# Patient Record
Sex: Female | Born: 1980 | Hispanic: Yes | Marital: Married | Smoking: Never smoker
Health system: Southern US, Community
[De-identification: ages and names within clinical notes are randomized; demographics above are authoritative.]

## PROBLEM LIST (undated history)

## (undated) DIAGNOSIS — Z789 Other specified health status: Secondary | ICD-10-CM

## (undated) HISTORY — PX: NO PAST SURGERIES: SHX2092

---

## 2002-09-06 ENCOUNTER — Emergency Department (HOSPITAL_COMMUNITY): Admission: EM | Admit: 2002-09-06 | Discharge: 2002-09-07 | Payer: Self-pay | Admitting: Emergency Medicine

## 2002-09-09 ENCOUNTER — Emergency Department (HOSPITAL_COMMUNITY): Admission: EM | Admit: 2002-09-09 | Discharge: 2002-09-09 | Payer: Self-pay

## 2003-03-05 ENCOUNTER — Emergency Department (HOSPITAL_COMMUNITY): Admission: EM | Admit: 2003-03-05 | Discharge: 2003-03-06 | Payer: Self-pay | Admitting: Emergency Medicine

## 2003-04-29 ENCOUNTER — Ambulatory Visit (HOSPITAL_COMMUNITY): Admission: RE | Admit: 2003-04-29 | Discharge: 2003-04-29 | Payer: Self-pay | Admitting: *Deleted

## 2003-05-27 ENCOUNTER — Encounter (INDEPENDENT_AMBULATORY_CARE_PROVIDER_SITE_OTHER): Payer: Self-pay | Admitting: Specialist

## 2003-05-27 ENCOUNTER — Other Ambulatory Visit: Admission: RE | Admit: 2003-05-27 | Discharge: 2003-05-27 | Payer: Self-pay | Admitting: Obstetrics and Gynecology

## 2003-05-27 ENCOUNTER — Encounter: Admission: RE | Admit: 2003-05-27 | Discharge: 2003-05-27 | Payer: Self-pay | Admitting: Obstetrics and Gynecology

## 2003-06-17 ENCOUNTER — Inpatient Hospital Stay (HOSPITAL_COMMUNITY): Admission: AD | Admit: 2003-06-17 | Discharge: 2003-06-18 | Payer: Self-pay | Admitting: *Deleted

## 2003-08-15 ENCOUNTER — Inpatient Hospital Stay (HOSPITAL_COMMUNITY): Admission: AD | Admit: 2003-08-15 | Discharge: 2003-08-15 | Payer: Self-pay | Admitting: Gynecology

## 2003-08-28 ENCOUNTER — Inpatient Hospital Stay (HOSPITAL_COMMUNITY): Admission: AD | Admit: 2003-08-28 | Discharge: 2003-08-28 | Payer: Self-pay | Admitting: *Deleted

## 2003-09-11 ENCOUNTER — Inpatient Hospital Stay (HOSPITAL_COMMUNITY): Admission: AD | Admit: 2003-09-11 | Discharge: 2003-09-11 | Payer: Self-pay | Admitting: Gynecology

## 2003-09-18 ENCOUNTER — Encounter: Admission: RE | Admit: 2003-09-18 | Discharge: 2003-09-18 | Payer: Self-pay | Admitting: *Deleted

## 2003-09-18 ENCOUNTER — Inpatient Hospital Stay (HOSPITAL_COMMUNITY): Admission: AD | Admit: 2003-09-18 | Discharge: 2003-09-21 | Payer: Self-pay | Admitting: Family Medicine

## 2003-09-18 ENCOUNTER — Ambulatory Visit: Payer: Self-pay | Admitting: Family Medicine

## 2005-06-25 IMAGING — US US OB COMP LESS 14 WK
2 series · 14 of 27 positions shown · non-contrast
Comparison: none

CLINICAL DATA: Right lower abdominal pain.  Positive pregnancy test.  
 OBSTETRICAL ULTRASOUND

[Series 1: unknown · 0.23mm/px · 9 of 17 slices shown (1 of 2)]
[im 1/17]
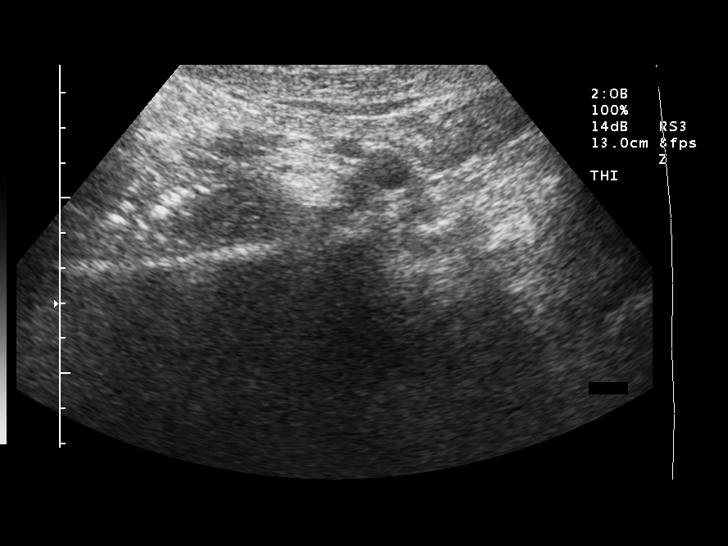
[im 3/17]
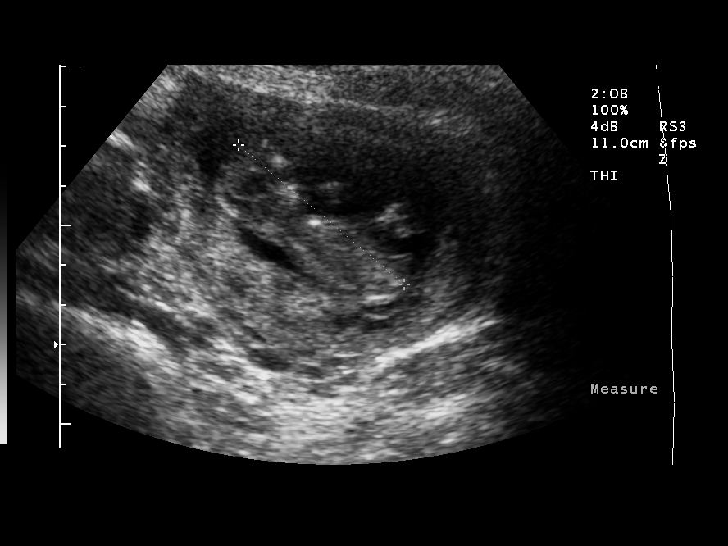
[im 5/17]
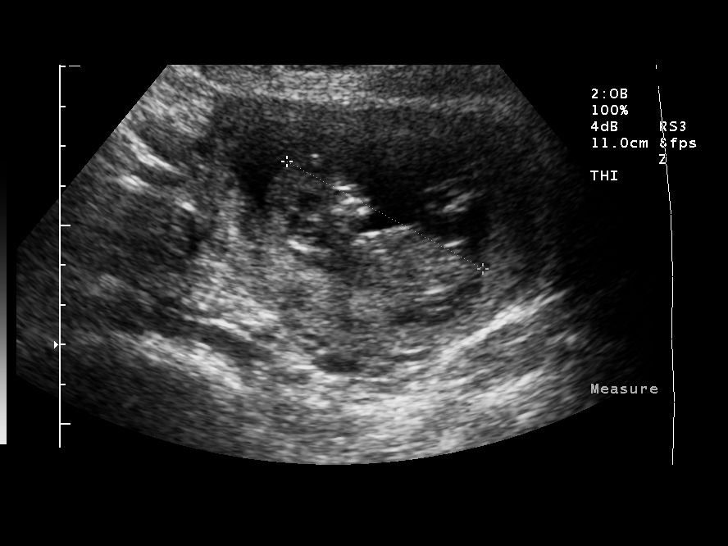
[im 7/17]
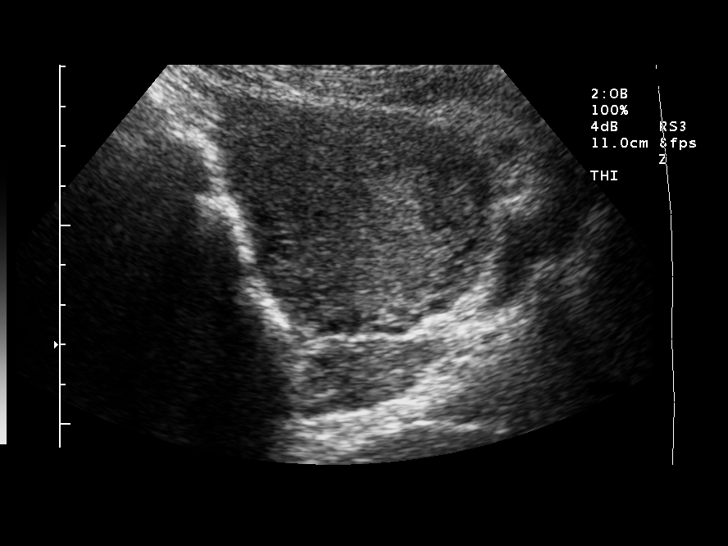
[im 9/17]
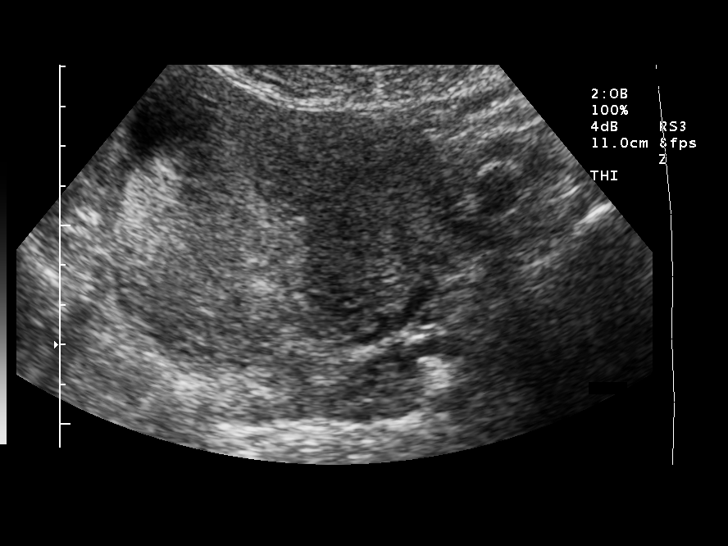
[im 11/17]
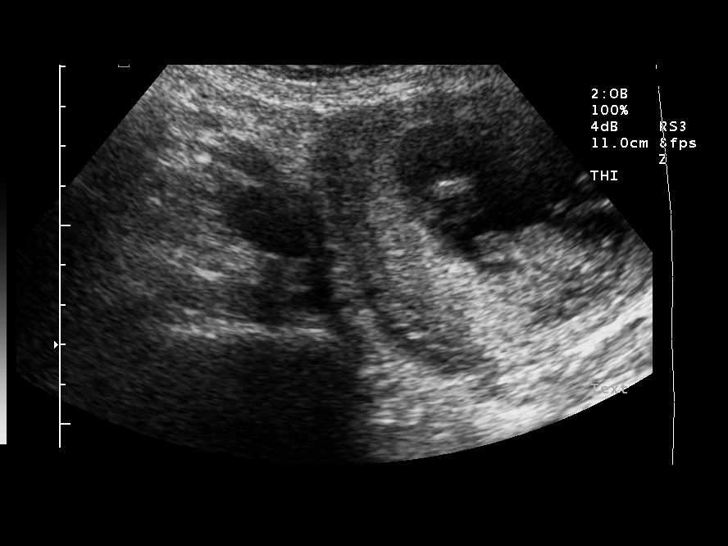
[im 13/17]
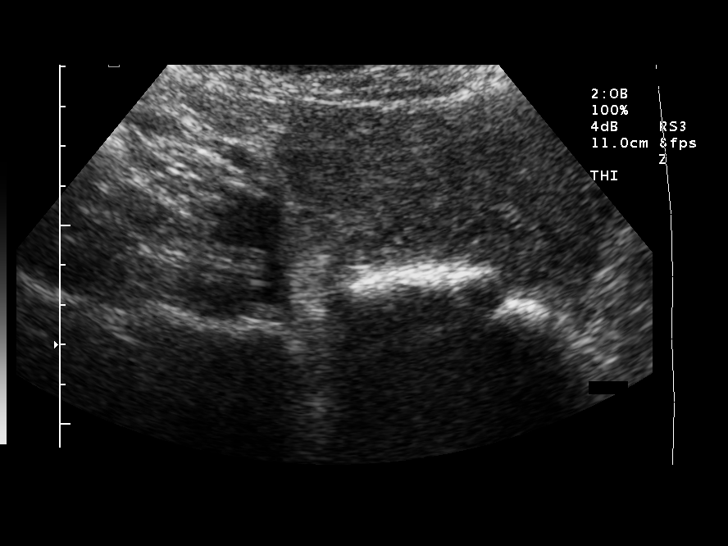
[im 15/17]
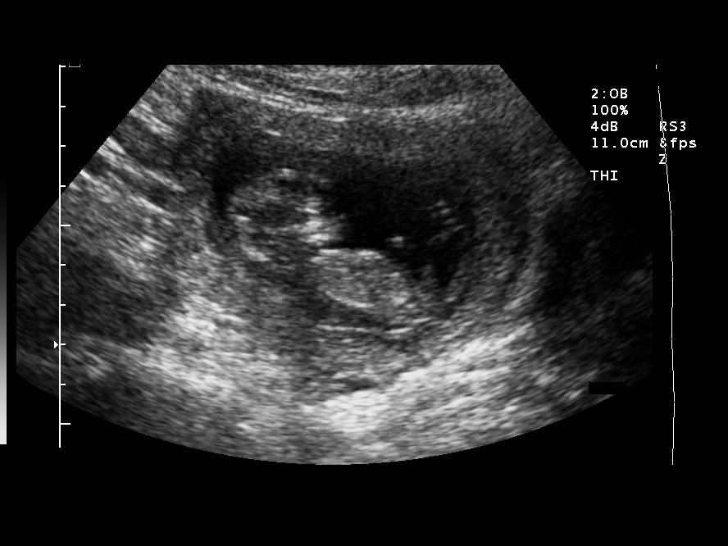
[im 17/17]
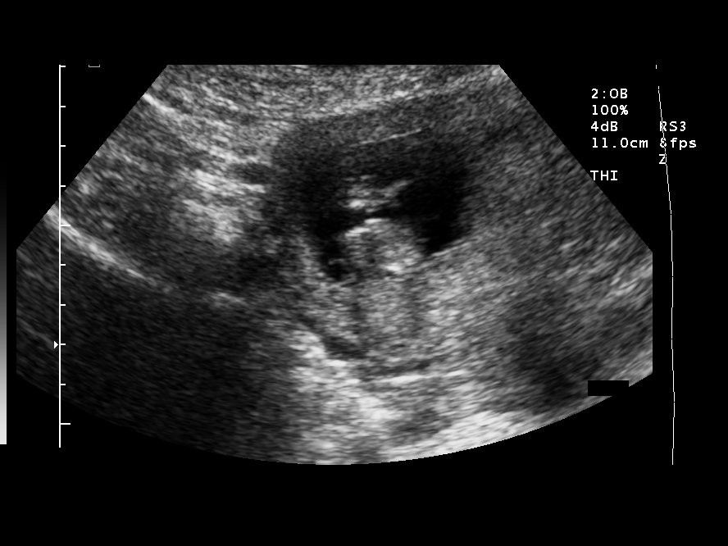

[Series 1: unknown · 0.22mm/px · 5 of 10 slices shown (2 of 2)]
[im 2/10]
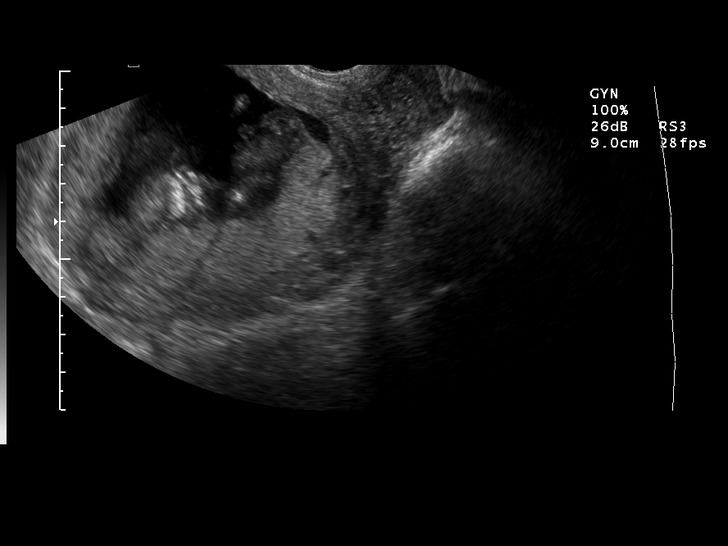
[im 4/10]
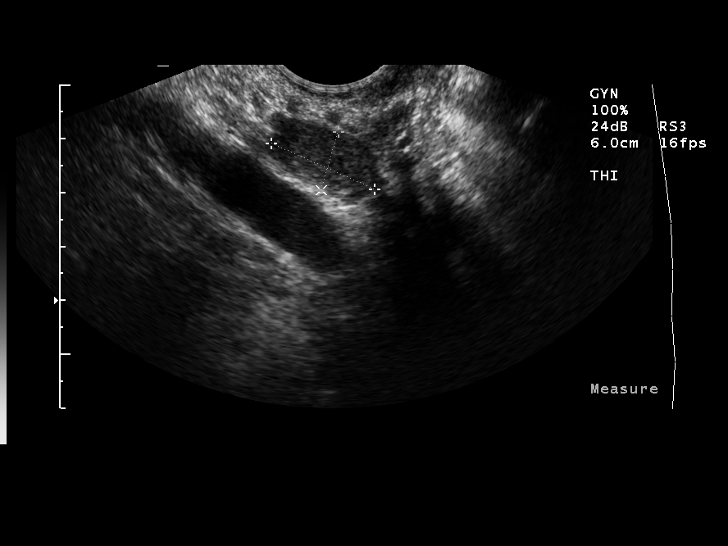
[im 6/10]
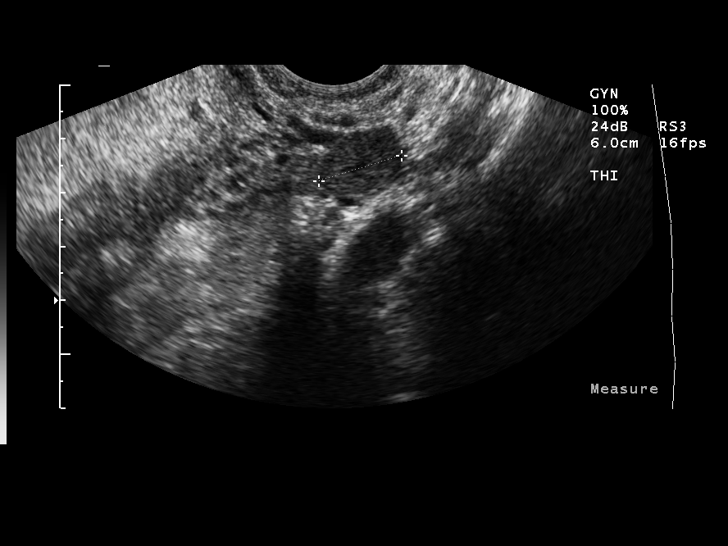
[im 8/10]
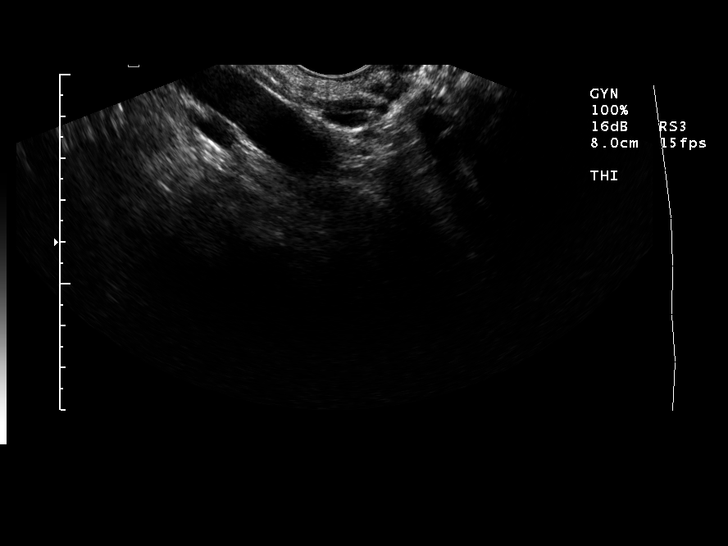
[im 10/10]
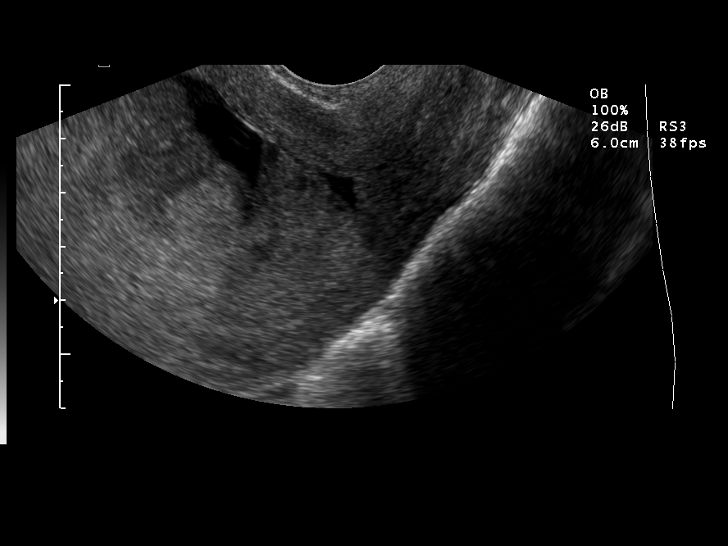

[14 of 27 positions shown; findings below may reference images not displayed]

FINDINGS: Single live intrauterine gestation with crown rump length of 5.5 mm (12 weeks 1 day).  Heart rate at 137 beats per minute.  There is a tiny subchorionic hemorrhage noted.  Left ovary 11 x 16 x 20 mm, unremarkable.  Right ovary is not visualized.  No free fluid.
 IMPRESSION
 Single live 12 week 1 day intrauterine gestation.

## 2005-08-19 IMAGING — US US OB COMP +14 WK
1 series · 13 of 28 positions shown · non-contrast
Comparison: none

CLINICAL DATA: 20 week 1 day assigned gestational age by prior ultrasound.  Evaluate growth and anatomy.

[Series 1: unknown · 0.32mm/px · 13 of 61 slices shown]
[im 3/61]
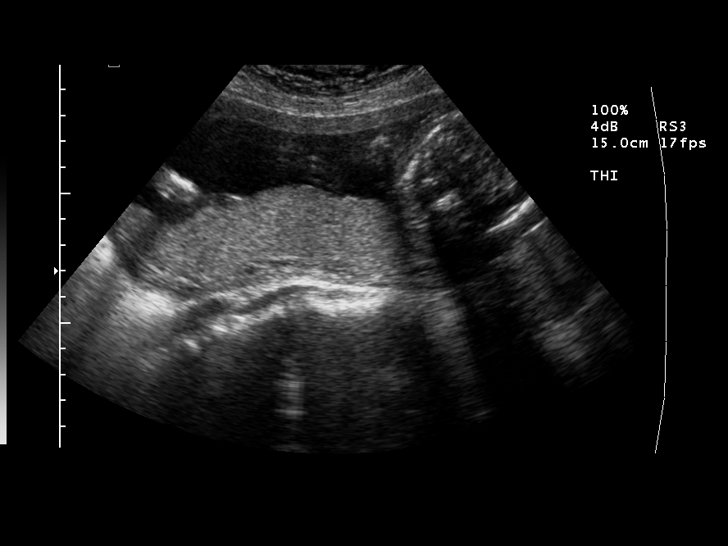
[im 7/61]
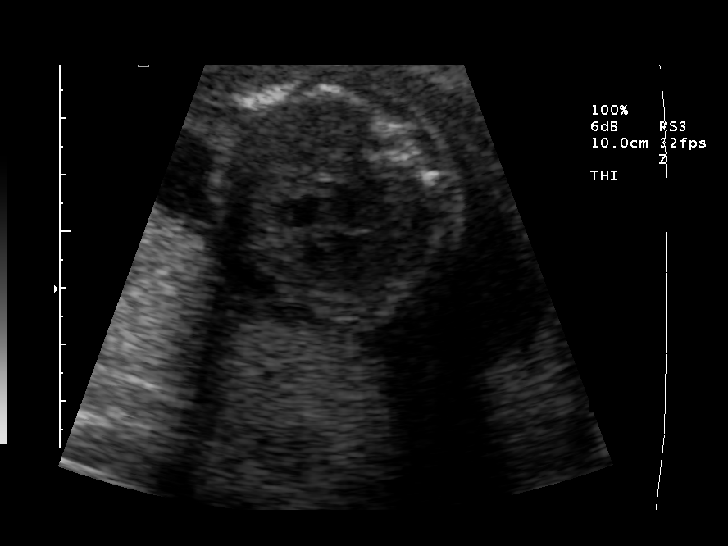
[im 12/61]
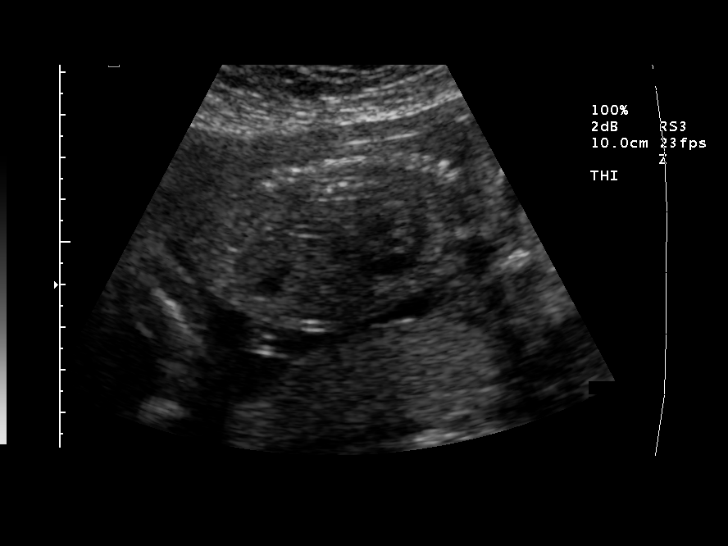
[im 16/61]
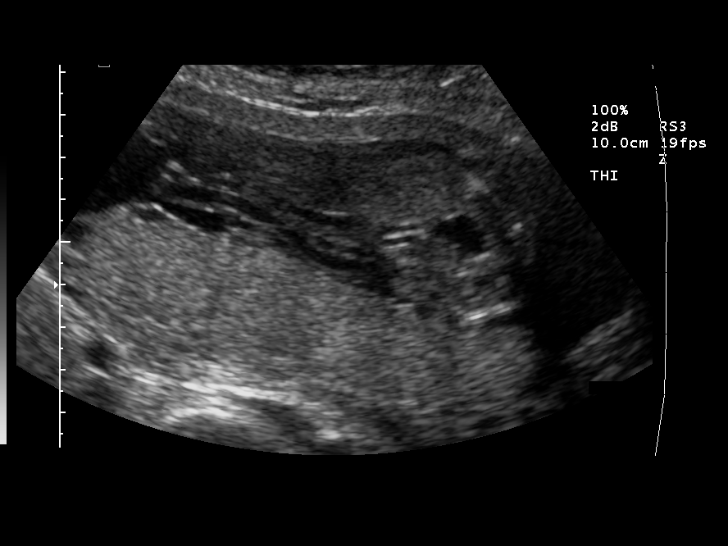
[im 21/61]
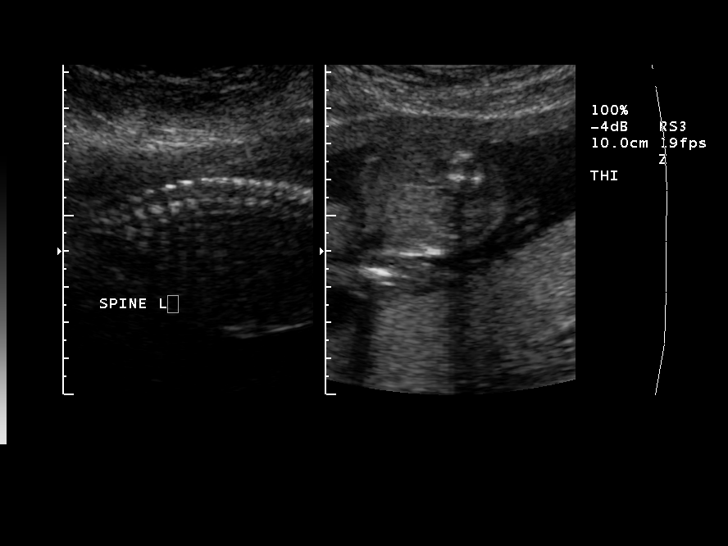
[im 25/61]
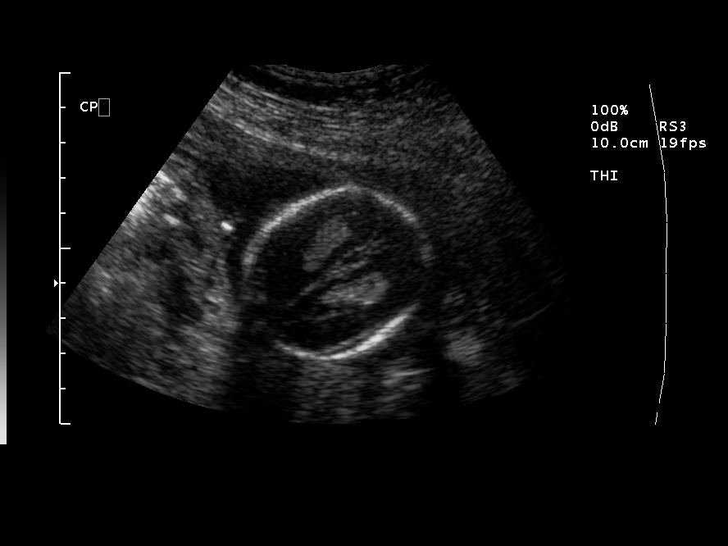
[im 32/61]
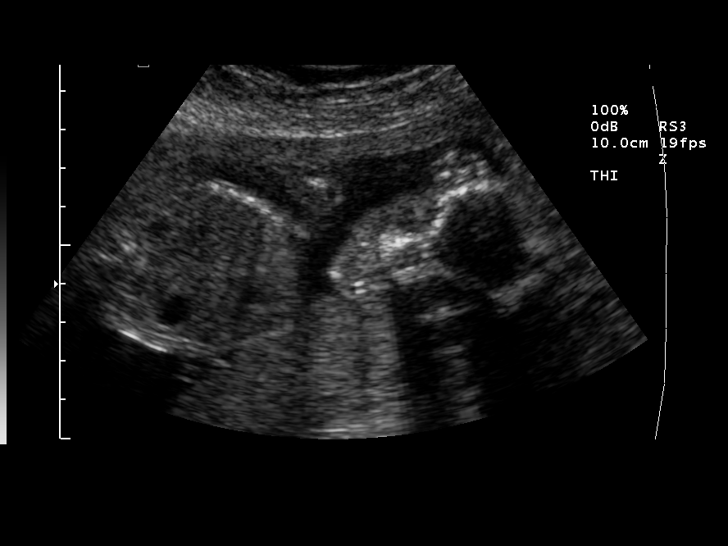
[im 36/61]
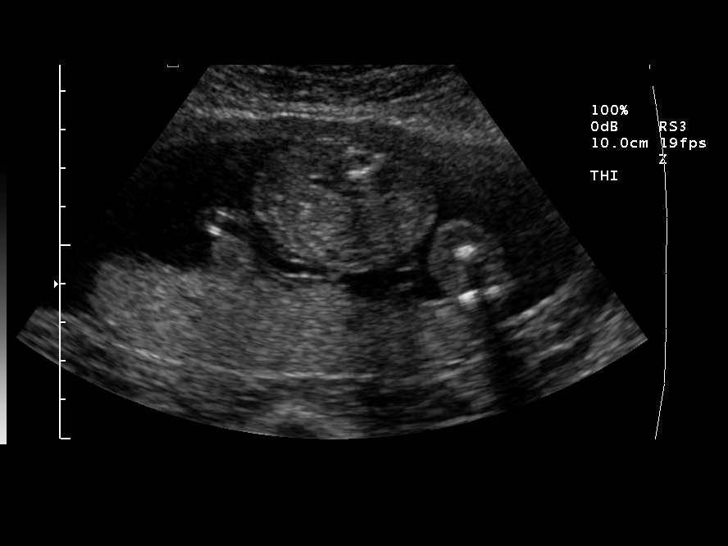
[im 41/61]
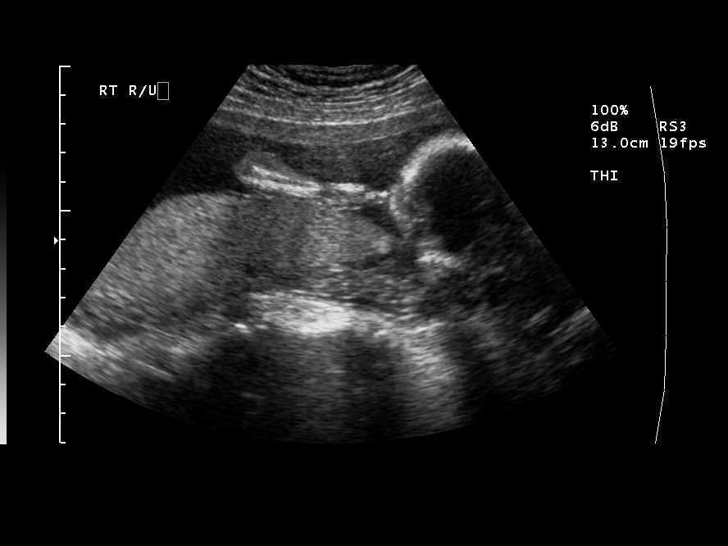
[im 45/61]
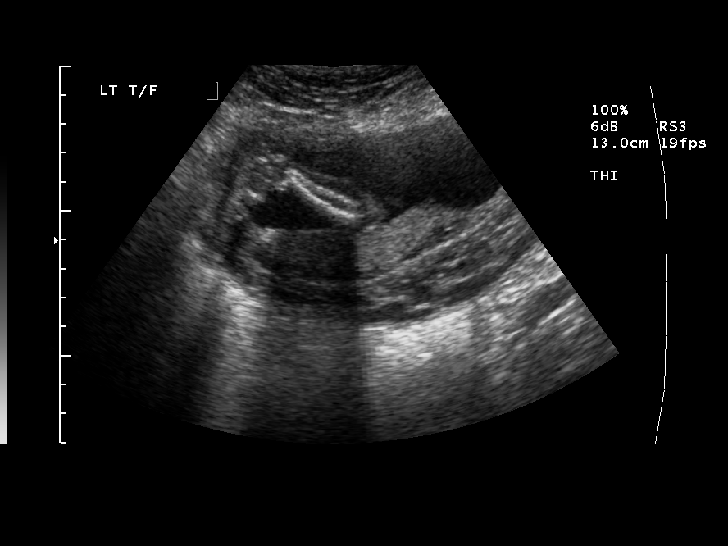
[im 49/61]
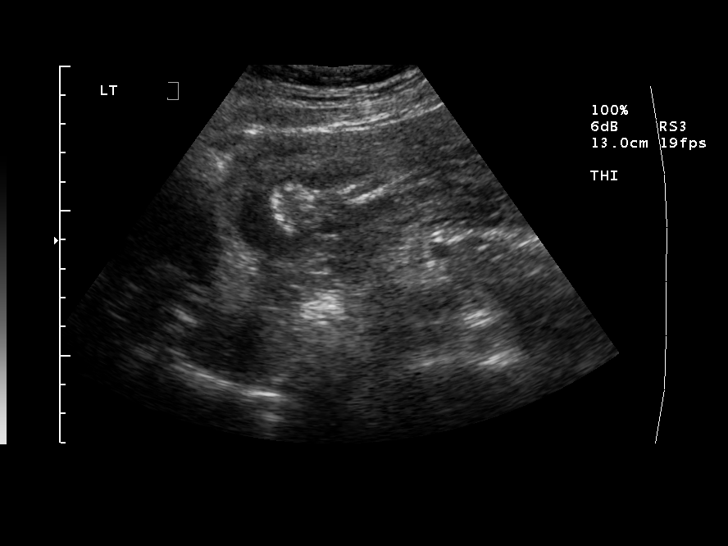
[im 54/61]
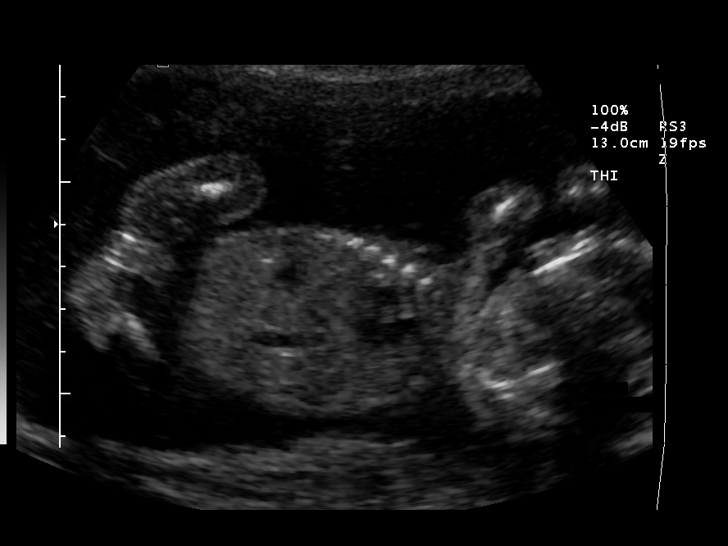
[im 58/61]
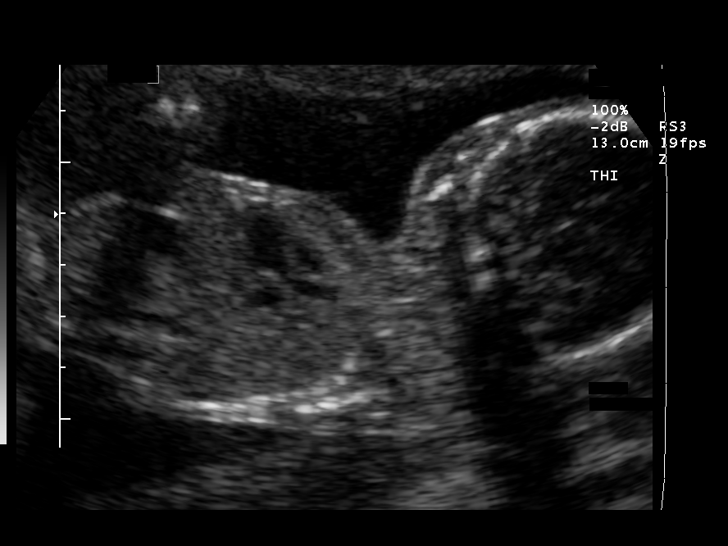

[13 of 28 positions shown; findings below may reference images not displayed]

OBSTETRICAL ULTRASOUND
Number of Fetuses:  1
Heart Rate:  127
Movement:  Yes
Breathing:  No  
Presentation:  Cephalic
Placental Location:  Posterior
Grade:  I
Previa:  No
Amniotic Fluid (Subjective):  Normal
Amniotic Fluid (Objective):   3.4 cm Vertical pocket 

FETAL BIOMETRY
BPD:  4.5 cm   19 w 5 d
HC:  16.9 cm  19 w 4 d
AC:  14.5 cm  19 w 6 d
FL:  3.0 cm  19 w 3 d

MEAN GA:  19 w 5 d

FETAL ANATOMY
Lateral Ventricles:    Visualized 
Thalami/CSP:      Visualized 
Posterior Fossa:  Visualized 
Nuchal Region:    Visualized 
Spine:      Visualized 
4 Chamber Heart on Left:      Visualized 
Stomach on Left:      Visualized 
3 Vessel Cord:    Visualized 
Cord Insertion site:    Visualized 
Kidneys:  Visualized 
Bladder:  Visualized 
Extremities:      Visualized 

ADDITIONAL ANATOMY VISUALIZED:  LVOT, RVOT, upper lip, orbits, diaphragm, heel, 5th digit, ductal arch, aortic arch, and female genitalia

MATERNAL FINDINGS
Cervix:   3.7 cm Transabdominally
IMPRESSION: Assigned gestational age by prior ultrasound is currently 20 weeks 1 day.  Appropriate fetal growth.
No evidence of fetal anatomic abnormality.

## 2006-01-01 IMAGING — US US FETAL BPP W/O NONSTRESS
1 series · 18 of 28 positions shown · non-contrast
Comparison: none

CLINICAL DATA: Reactive NST with variables.  Assess fetal well being.

[Series 1: us fetal bpp w/o nonstress · non-contrast · 18 of 40 slices shown]
[im 1/40]
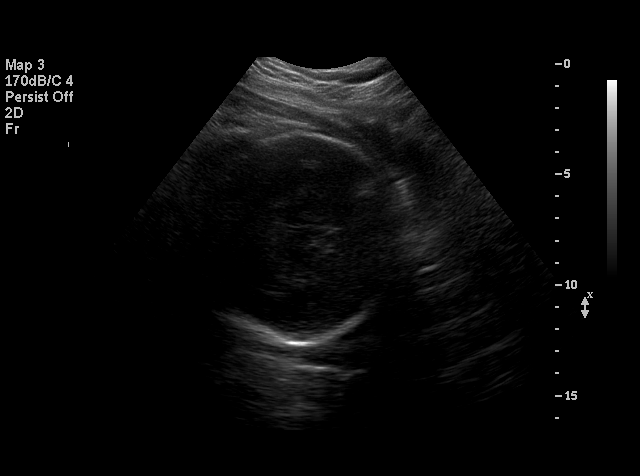
[im 3/40]
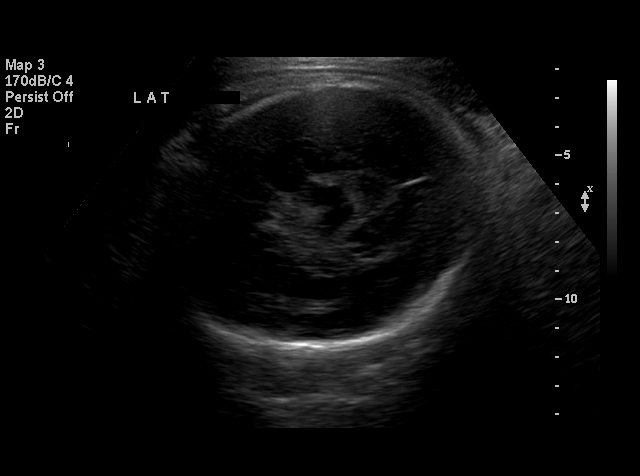
[im 5/40]
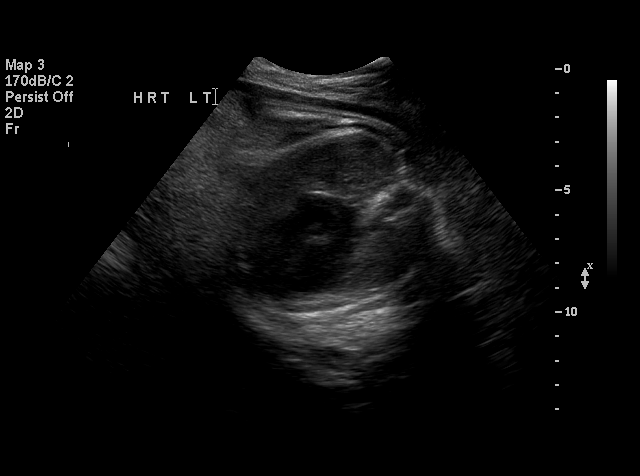
[im 8/40]
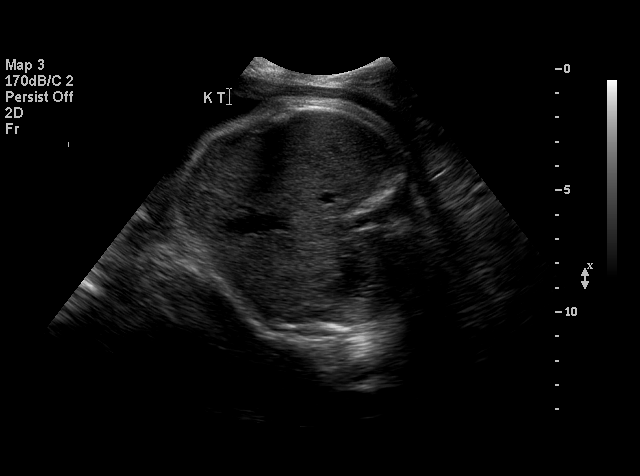
[im 11/40]
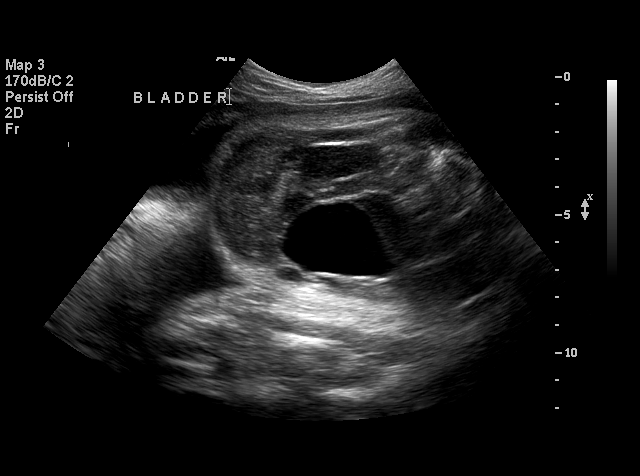
[im 12/40]
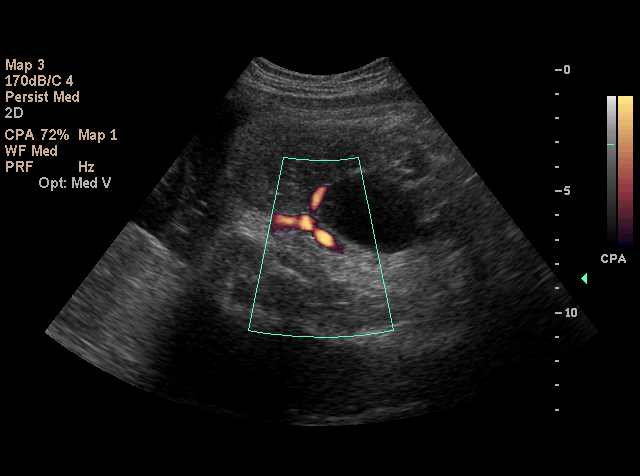
[im 15/40]
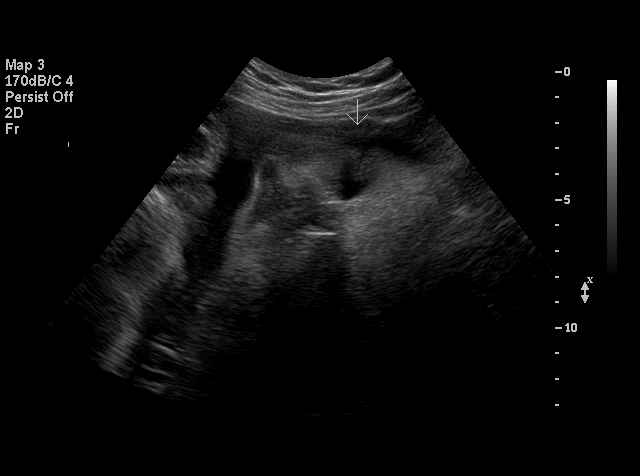
[im 16/40]
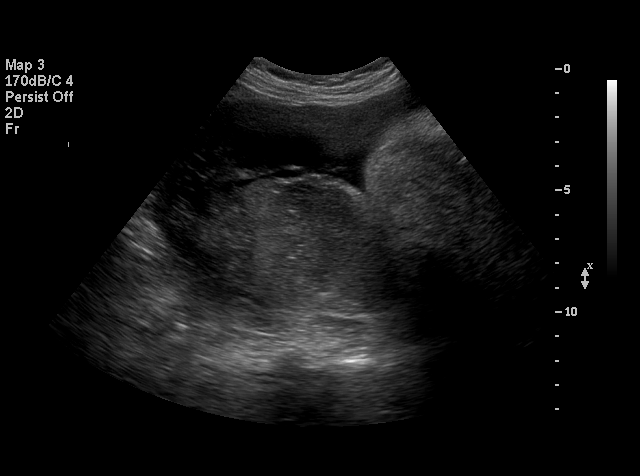
[im 19/40]
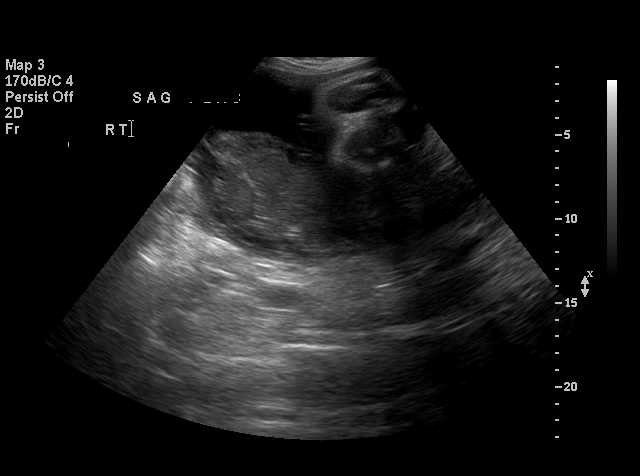
[im 21/40]
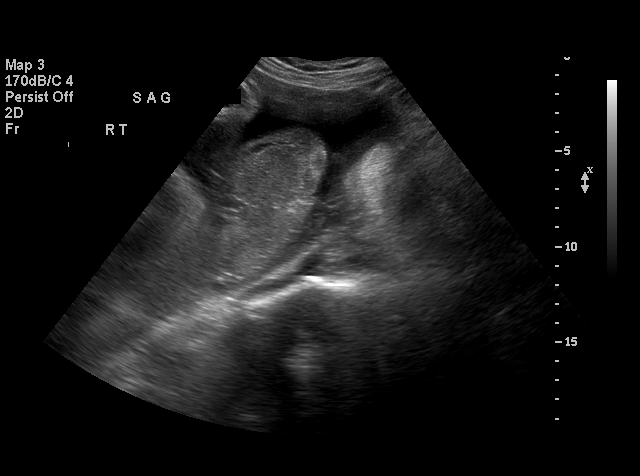
[im 24/40]
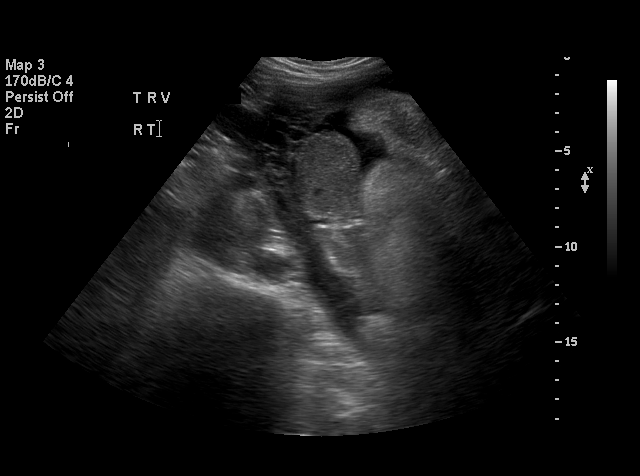
[im 25/40]
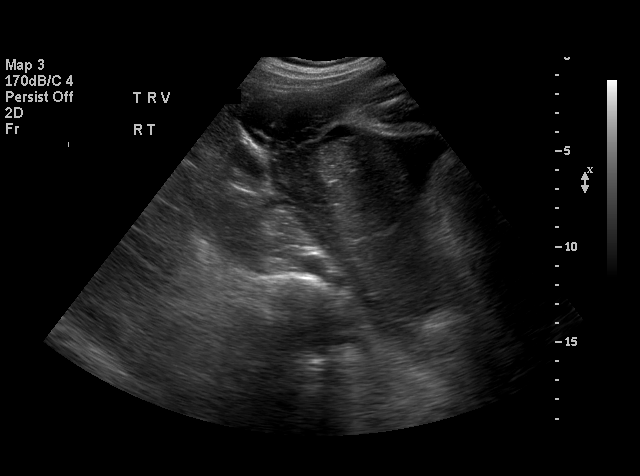
[im 28/40]
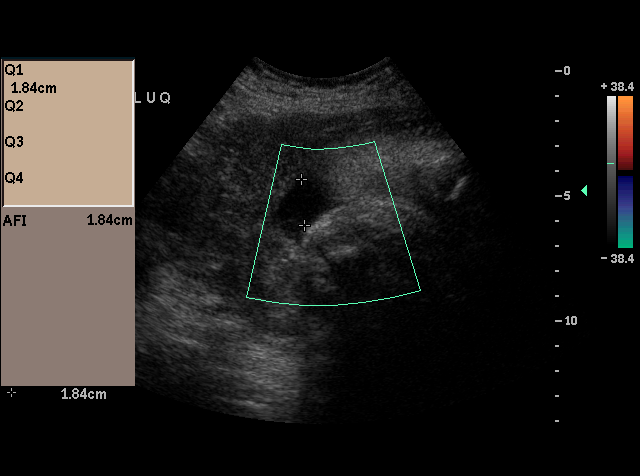
[im 31/40]
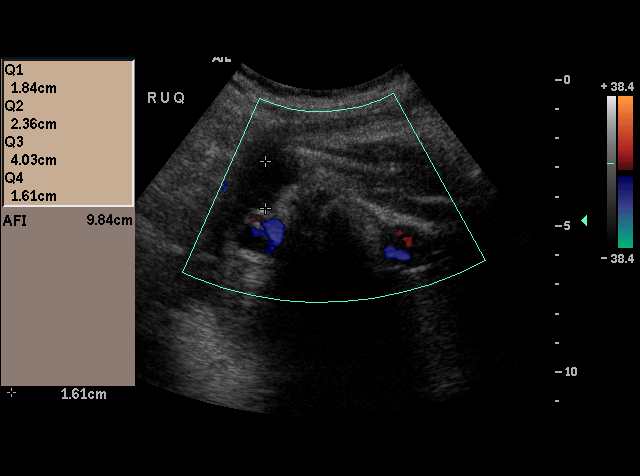
[im 32/40]
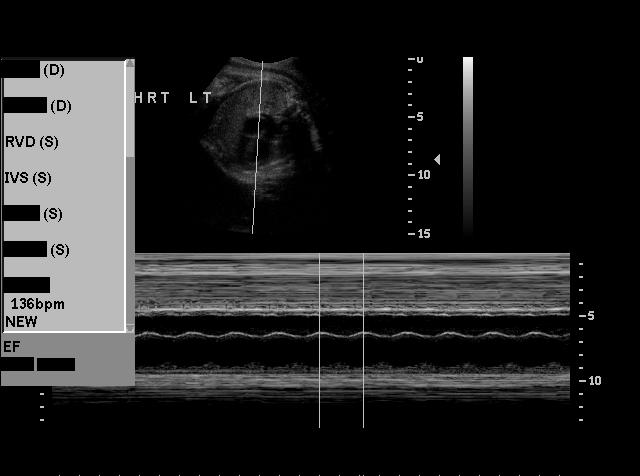
[im 35/40]
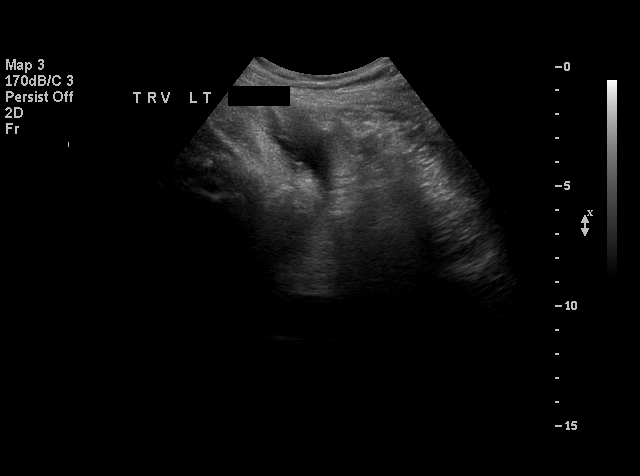
[im 37/40]
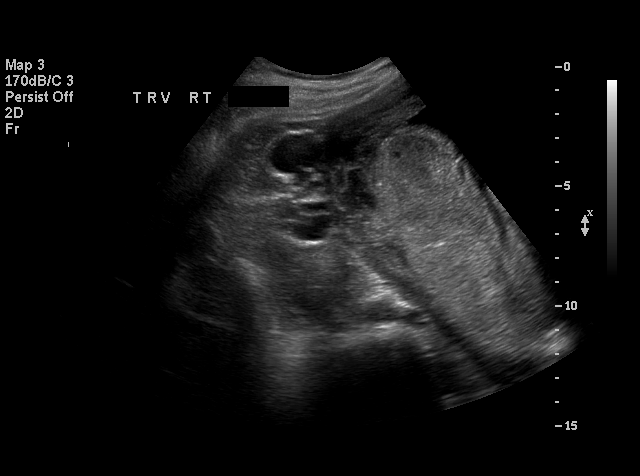
[im 40/40]
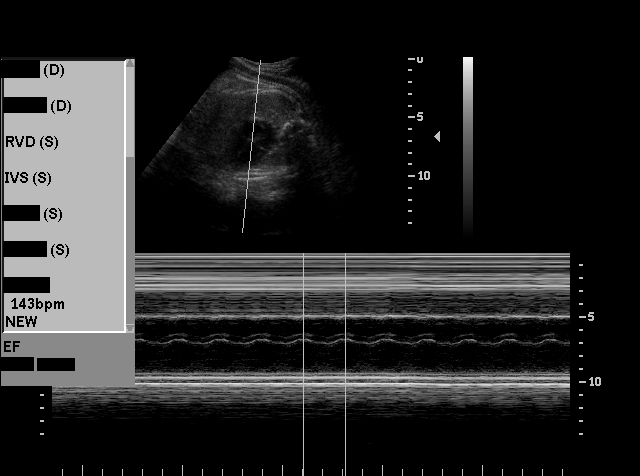

[18 of 28 positions shown; findings below may reference images not displayed]

BIOPHYSICAL PROFILE

Number of Fetuses:  1
Heart rate:  130 BPM
Movement:  Yes
Breathing:  Yes
Presentation:  Vertex
Placental Location:  Posterior
Grade:  II
Previa:  No
Amniotic Fluid (Subjective):  Normal
Amniotic Fluid (Objective):  AFI 9.8 cm (2th-72th %ile =  7.2 to 22.6 cm for 39 weeks) 

Fetal measurements and complete anatomic evaluation were not requested.  The following fetal anatomy was visualized on this exam:  Lateral ventricles, four chamber heart, stomach, three vessel cord, kidneys, bladder, and diaphragm.

BPP SCORING
Movements:  2  Time:  30 minutes
Breathing:  2
Tone:  2
Amniotic Fluid:  2
Total Score:  8

MATERNAL FINDINGS:
Cervix:  Not evaluated, > 37 weeks.
IMPRESSION: Biophysical profile score [DATE].
Subjectively and quantitatively normal amniotic fluid volume.
No late-developing fetal anatomic abnormalities are identified associated with the four chamber heart, lateral ventricles, stomach, kidneys, or bladder.

## 2010-01-09 ENCOUNTER — Emergency Department (HOSPITAL_COMMUNITY)
Admission: EM | Admit: 2010-01-09 | Discharge: 2010-01-09 | Payer: Self-pay | Source: Home / Self Care | Admitting: Emergency Medicine

## 2010-04-19 LAB — URINALYSIS, ROUTINE W REFLEX MICROSCOPIC
Bilirubin Urine: NEGATIVE
Glucose, UA: NEGATIVE mg/dL
Ketones, ur: NEGATIVE mg/dL
Nitrite: NEGATIVE
Protein, ur: NEGATIVE mg/dL
Specific Gravity, Urine: 1.014 (ref 1.005–1.030)
Urobilinogen, UA: 0.2 mg/dL (ref 0.0–1.0)
pH: 6.5 (ref 5.0–8.0)

## 2010-04-19 LAB — POCT PREGNANCY, URINE: Preg Test, Ur: NEGATIVE

## 2010-04-19 LAB — URINE MICROSCOPIC-ADD ON

## 2010-04-19 LAB — WET PREP, GENITAL
Trich, Wet Prep: NONE SEEN
Yeast Wet Prep HPF POC: NONE SEEN

## 2010-04-19 LAB — GC/CHLAMYDIA PROBE AMP, GENITAL
Chlamydia, DNA Probe: NEGATIVE
GC Probe Amp, Genital: NEGATIVE

## 2010-04-19 LAB — HCG, QUANTITATIVE, PREGNANCY: hCG, Beta Chain, Quant, S: <2 m[IU]/mL (ref <5)

## 2010-06-25 NOTE — Group Therapy Note (Signed)
Jodi Santos, Jodi Santos               ACCOUNT NO.:  [id number]   MEDICAL RECORD NO.:  [id number]                   PATIENT TYPE:  OUT   LOCATION:  [facility]                           FACILITY:  [facility]   PHYSICIAN:  Argentina Donovan, MD                     DATE OF BIRTH:  [birth year]   DATE OF SERVICE:  05/27/2003                                    CLINIC NOTE   PROCEDURE DONE:  Colposcopy.   The patient is a 30 year old gravida 5 para 2-0-2-2 pregnant with an  estimated date of confinement of September 16, 2003; i.e. [redacted] weeks gestation.  [facility] and had an HSIL Pap smear.  On examination the  cervix is markedly hypertrophied with large area of squamous metaplasia and  the transition zone seen 360 degrees with marked acetowhite changes around  the entire cervix with multiple areas of mosaicism and punctation, no  atypical vessels seen, heavy leukorrhea for which wet prep and DNA for GC  and chlamydia were taken.  Biopsy was taken of the area that looked the most  abnormal.   IMPRESSION:  Severe cervical dysplasia, at least cervical intraepithelial  lesion grade 2 to 3.                                               Argentina Donovan, MD    PR/MEDQ  D:  05/27/2003  T:  05/27/2003  Job:  [id number]

## 2014-12-09 LAB — OB RESULTS CONSOLE ABO/RH: RH Type: POSITIVE

## 2014-12-09 LAB — OB RESULTS CONSOLE GC/CHLAMYDIA
CHLAMYDIA, DNA PROBE: NEGATIVE
GC PROBE AMP, GENITAL: NEGATIVE

## 2014-12-09 LAB — OB RESULTS CONSOLE HIV ANTIBODY (ROUTINE TESTING): HIV: NONREACTIVE

## 2014-12-09 LAB — OB RESULTS CONSOLE RUBELLA ANTIBODY, IGM: Rubella: IMMUNE

## 2014-12-09 LAB — OB RESULTS CONSOLE ANTIBODY SCREEN: Antibody Screen: NEGATIVE

## 2014-12-09 LAB — OB RESULTS CONSOLE HEPATITIS B SURFACE ANTIGEN: HEP B S AG: NEGATIVE

## 2014-12-24 LAB — OB RESULTS CONSOLE RPR: RPR: NONREACTIVE

## 2015-02-08 NOTE — L&D Delivery Note (Signed)
Patient is 35 y.o. [id number] 822w2d admitted in [facility]. Uncomplicated history. Progressed quickly to complete.    Delivery Note At 11:28 PM a viable female was delivered via Vaginal, Spontaneous Delivery (Presentation: Left Occiput Anterior).  Clinically identified shoulder dystocia with turtling of fetal head at the perineum.  RN performed McRoberts and suprapubic. I performed woodscrew and reverse woodscrew without good result. I was able to grasp the posterior arm (right arm) and this was delivered easily. I was able to feel a pop as the arm delivered but after delivery the infant was moving the right arm well. . APGAR: 8, 9; weight - Pending. Placenta status: Intact, Spontaneous.  Cord: 3 vessels with the following complications: None.  Cord pH: not collected  Anesthesia: None  Episiotomy: None Lacerations: 1st degree;Perineal Suture Repair: 3.0 vicryl Est. Blood Loss (mL): 50  Mom to postpartum.  Baby to Couplet care / Skin to Skin.  Isa RankinKimberly Niles [facility] 07/12/2015, 12:16 AM

## 2015-07-05 ENCOUNTER — Inpatient Hospital Stay (HOSPITAL_COMMUNITY)
Admission: AD | Admit: 2015-07-05 | Discharge: 2015-07-05 | Disposition: A | Discharge status: Home / Self Care | Payer: Self-pay | Source: Ambulatory Visit | Attending: Family Medicine | Admitting: Family Medicine

## 2015-07-05 ENCOUNTER — Encounter (HOSPITAL_COMMUNITY): Payer: Self-pay | Admitting: Certified Nurse Midwife

## 2015-07-05 DIAGNOSIS — O471 False labor at or after 37 completed weeks of gestation: Secondary | ICD-10-CM | POA: Insufficient documentation

## 2015-07-05 DIAGNOSIS — Z3A39 39 weeks gestation of pregnancy: Secondary | ICD-10-CM | POA: Insufficient documentation

## 2015-07-05 DIAGNOSIS — N898 Other specified noninflammatory disorders of vagina: Secondary | ICD-10-CM | POA: Insufficient documentation

## 2015-07-05 HISTORY — DX: Other specified health status: Z78.9

## 2015-07-05 LAB — POCT FERN TEST: POCT Fern Test: NEGATIVE

## 2015-07-05 NOTE — MAU Provider Note (Signed)
S:  Jodi Santos is a 35 y.o. female [id number] @ 964w2d here today with complaints of contractions and vaginal discharge. She noticed an increase in vaginal discharge with ? Leaking yesterday afternoon. The discharge has continued today and she came in to be evaluated to see if her water broke.   She feels occasional contractions. Denies vaginal bleeding. + fetal movement.  [facility] [place] [facility] [facility] Patient brought her records with her today and gave to RN to scan into the system.    O:  GENERAL: Well-developed, well-nourished female in no acute distress.  LUNGS: Effort normal SKIN: Warm, dry and without erythema ABDOMEN: soft, non tender. Gravid  PSYCH: Normal mood and affect  Filed Vitals:   07/05/15 1141  BP: 122/84  Pulse: 97  Temp: 98.4 F (36.9 C)  TempSrc: Oral  Resp: 18   Fetal Tracing: Baseline: 135 bpm  Variability: Moderate  Accelerations: 15x15 Decelerations: None Toco: None  Speculum exam: Vagina - Small amount of creamy discharge, no odor. No pooling of fluid  Cervix - No contact bleeding, no active bleeding, visually closed.  Bimanual exam: Dilation: Closed Effacement (%): Thick Exam by:: Shela CommonsJ. Teresa Nicodemus NP Chaperone present for exam.    A:  Vaginal discharge in pregnancy; Fern slide negative  Braxton hicks contractions    P:  Discharge home in stable condition Keep appointment with OB on Wednesday Labor precautions Return to MAU if symptoms worsen Fetal kick counts.    Duane LopeJennifer I Jareb Radoncic, NP 07/05/2015 1:35 PM

## 2015-07-05 NOTE — MAU Note (Signed)
Pt states she has been having ctxs since yesterday that are 10 minutes apart. Pt denies LOF or vaginal bleeding. Fetus active.

## 2015-07-05 NOTE — Discharge Instructions (Signed)
Braxton Hicks Contractions Contractions of the uterus can occur throughout pregnancy. Contractions are not always a sign that you are in labor.  WHAT ARE BRAXTON HICKS CONTRACTIONS?  Contractions that occur before labor are called Braxton Hicks contractions, or false labor. Toward the end of pregnancy (32-34 weeks), these contractions can develop more often and may become more forceful. This is not true labor because these contractions do not result in opening (dilatation) and thinning of the cervix. They are sometimes difficult to tell apart from true labor because these contractions can be forceful and people have different pain tolerances. You should not feel embarrassed if you go to the hospital with false labor. Sometimes, the only way to tell if you are in true labor is for your health care provider to look for changes in the cervix. If there are no prenatal problems or other health problems associated with the pregnancy, it is completely safe to be sent home with false labor and await the onset of true labor. HOW CAN YOU TELL THE DIFFERENCE BETWEEN TRUE AND FALSE LABOR? False Labor  The contractions of false labor are usually shorter and not as hard as those of true labor.   The contractions are usually irregular.   The contractions are often felt in the front of the lower abdomen and in the groin.   The contractions may go away when you walk around or change positions while lying down.   The contractions get weaker and are shorter lasting as time goes on.   The contractions do not usually become progressively stronger, regular, and closer together as with true labor.  True Labor  Contractions in true labor last 30-70 seconds, become very regular, usually become more intense, and increase in frequency.   The contractions do not go away with walking.   The discomfort is usually felt in the top of the uterus and spreads to the lower abdomen and low back.   True labor can be  determined by your health care provider with an exam. This will show that the cervix is dilating and getting thinner.  WHAT TO REMEMBER  Keep up with your usual exercises and follow other instructions given by your health care provider.   Take medicines as directed by your health care provider.   Keep your regular prenatal appointments.   Eat and drink lightly if you think you are going into labor.   If Braxton Hicks contractions are making you uncomfortable:   Change your position from lying down or resting to walking, or from walking to resting.   Sit and rest in a tub of warm water.   Drink 2-3 glasses of water. Dehydration may cause these contractions.   Do slow and deep breathing several times an hour.  WHEN SHOULD I SEEK IMMEDIATE MEDICAL CARE? Seek immediate medical care if:  Your contractions become stronger, more regular, and closer together.   You have fluid leaking or gushing from your vagina.   You have a fever.   You have vaginal bleeding.   You have continuous abdominal pain.   You have low back pain that you never had before.   You feel your baby's head pushing down and causing pelvic pressure.   Your baby is not moving as much as it used to.    This information is not intended to replace advice given to you by your health care provider. Make sure you discuss any questions you have with your health care provider.   Document Released: 01/24/2005  Document Revised: 01/29/2013 Document Reviewed: 11/05/2012 Elsevier Interactive Patient Education 2016 [employer]. IAC/InterActiveCorpContracciones de Designer, multimediaBraxton Hicks (Braxton Hicks Contractions) Durante el Troutembarazo, pueden presentarse contracciones uterinas que no siempre indican que est en Ogdentrabajo de parto.  QU SON LAS CONTRACCIONES DE BRAXTON HICKS?  Las State Farmcontracciones que se presentan antes del Reynolds Heightstrabajo de Hullparto se conocen como contracciones de Sammons PointBraxton Hicks o falso trabajo de Slingerparto. Hacia el final del  embarazo (32 a 34semanas), estas contracciones pueden aparecen con ms frecuencia y volverse ms intensas. No corresponden al Aleen Campitrabajo de parto verdadero porque estas contracciones no producen el agrandamiento (la dilatacin) y el afinamiento del cuello del tero. Algunas veces, es difcil distinguirlas del trabajo de parto verdadero porque en algunos casos pueden ser D.R. Horton, Incmuy intensas, y las personas tienen diferentes niveles de tolerancia al Merck & Codolor. No debe sentirse avergonzada si concurre al hospital con falso trabajo de Gilber[place]parto. En ocasiones, la nica forma de saber si el trabajo de parto es verdadero es que el mdico determine si hay cambios en el cuello del tero. Si no hay problemas prenatales u otras complicaciones de salud asociadas con el embarazo, no habr inconvenientes si la envan a su casa con falso trabajo de parto y espera que comience el verdadero. CMO DIFERENCIAR EL TRABAJO DE PARTO FALSO DEL VERDADERO Falso trabajo de parto  Las contracciones del falso trabajo de parto duran menos y no son tan intensas como las verdaderas.  Generalmente son irregulares.  A menudo, se sienten en la parte delantera de la parte baja del abdomen y en la ingle,  y pueden desaparecer cuando camina o cambia de posicin mientras est acostada.  Las contracciones se vuelven ms dbiles y su duracin es Adult nursemenor a medida que el tiempo transcurre.  Por lo general, no se hacen progresivamente ms intensas, regulares y Herbalistcercanas entre s como en el caso del Stantontrabajo de parto verdadero. Theodis BlazeVerdadero trabajo de parto  Las contracciones del verdadero trabajo de parto duran de 30 a 70segundos, son muy regulares y suelen volverse ms intensas, y Lesothoaumenta su frecuencia.  No desaparecen cuando camina.  La molestia generalmente se siente en la parte superior del tero y se extiende hacia la zona inferior del abdomen y Parker Hannifinhacia la cintura.  El mdico podr examinarla para determinar si el trabajo de parto es verdadero. El examen  mostrar si el cuello del tero se est dilatando y Andersonafinando. LO QUE DEBE RECORDAR  Contine haciendo los ejercicios habituales y siga otras indicaciones que el mdico le d.  Tome todos los medicamentos como le indic el mdico.  OceanographerConcurra a las visitas prenatales regulares.  Coma y beba con moderacin si cree que est en trabajo de parto.  Si las contracciones de Dole FoodBraxton Hicks le provocan incomodidad:  Cambie de posicin: si est acostada o descansando, camine; si est caminando, descanse.  Sintese y descanse en una baera con agua tibia.  Beba 2 o 3vasos de [place]agua. La deshidratacin puede provocar contracciones.  Respire lenta y profundamente varias veces por hora. CUNDO DEBO BUSCAR ASISTENCIA MDICA INMEDIATA? Solicite atencin mdica de inmediato si:  Las contracciones se intensifican, se hacen ms regulares y Arboriculturistcercanas entre s.  Tiene una prdida de lquido por la vagina.  Tiene fiebre.  Tiene una hemorragia vaginal abundante.  Tiene dolor abdominal permanente.  Tiene un dolor en la zona lumbar que nunca tuvo antes.  Siente que la cabeza del beb empuja hacia abajo y ejerce presin en la zona plvica.  El beb no se mueve Dentisttanto como sola.  Esta informacin no tiene Theme park manager el consejo del mdico. Asegrese de hacerle al mdico cualquier pregunta que tenga.   Document Released: 11/03/2004 Document Revised: 01/29/2013 Elsevier Interactive Patient Education [employer]

## 2015-07-05 NOTE — MAU Note (Signed)
Pt states she may have leaked small amount of fluid last night & this morning.

## 2015-07-10 ENCOUNTER — Encounter (HOSPITAL_COMMUNITY): Payer: Self-pay

## 2015-07-10 ENCOUNTER — Inpatient Hospital Stay (HOSPITAL_COMMUNITY)
Admission: AD | Admit: 2015-07-10 | Discharge: 2015-07-10 | Disposition: A | Discharge status: Home / Self Care | Payer: Self-pay | Source: Ambulatory Visit | Attending: Obstetrics and Gynecology | Admitting: Obstetrics and Gynecology

## 2015-07-10 DIAGNOSIS — O26853 Spotting complicating pregnancy, third trimester: Secondary | ICD-10-CM

## 2015-07-10 DIAGNOSIS — O26893 Other specified pregnancy related conditions, third trimester: Secondary | ICD-10-CM | POA: Insufficient documentation

## 2015-07-10 DIAGNOSIS — N898 Other specified noninflammatory disorders of vagina: Secondary | ICD-10-CM | POA: Insufficient documentation

## 2015-07-10 DIAGNOSIS — Z3A40 40 weeks gestation of pregnancy: Secondary | ICD-10-CM | POA: Insufficient documentation

## 2015-07-10 DIAGNOSIS — N939 Abnormal uterine and vaginal bleeding, unspecified: Secondary | ICD-10-CM

## 2015-07-10 LAB — URINALYSIS, ROUTINE W REFLEX MICROSCOPIC
Bilirubin Urine: NEGATIVE
GLUCOSE, UA: NEGATIVE mg/dL
Hgb urine dipstick: NEGATIVE
Ketones, ur: NEGATIVE mg/dL
Nitrite: NEGATIVE
PROTEIN: NEGATIVE mg/dL
Specific Gravity, Urine: <1.005 — ABNORMAL LOW (ref 1.005–1.030)
pH: 7 (ref 5.0–8.0)

## 2015-07-10 LAB — URINE MICROSCOPIC-ADD ON

## 2015-07-10 NOTE — Discharge Instructions (Signed)
Contracciones de Braxton Hicks °(Braxton Hicks Contractions) °Durante el embarazo, pueden presentarse contracciones uterinas que no siempre indican que está en trabajo de parto.  °¿QUÉ SON LAS CONTRACCIONES DE BRAXTON HICKS?  °Las contracciones que se presentan antes del trabajo de parto se conocen como contracciones de Braxton Hicks o falso trabajo de parto. Hacia el final del embarazo (32 a 34 semanas), estas contracciones pueden aparecen con más frecuencia y volverse más intensas. No corresponden al trabajo de parto verdadero porque estas contracciones no producen el agrandamiento (la dilatación) y el afinamiento del cuello del útero. Algunas veces, es difícil distinguirlas del trabajo de parto verdadero porque en algunos casos pueden ser muy intensas, y las personas tienen diferentes niveles de tolerancia al dolor. No debe sentirse avergonzada si concurre al hospital con falso trabajo de parto. En ocasiones, la única forma de saber si el trabajo de parto es verdadero es que el médico determine si hay cambios en el cuello del útero. °Si no hay problemas prenatales u otras complicaciones de salud asociadas con el embarazo, no habrá inconvenientes si la envían a su casa con falso trabajo de parto y espera que comience el verdadero. °CÓMO DIFERENCIAR EL TRABAJO DE PARTO FALSO DEL VERDADERO °Falso trabajo de parto °· Las contracciones del falso trabajo de parto duran menos y no son tan intensas como las verdaderas. °· Generalmente son irregulares. °· A menudo, se sienten en la parte delantera de la parte baja del abdomen y en la ingle, °· y pueden desaparecer cuando camina o cambia de posición mientras está acostada. °· Las contracciones se vuelven más débiles y su duración es menor a medida que el tiempo transcurre. °· Por lo general, no se hacen progresivamente más intensas, regulares y cercanas entre sí como en el caso del trabajo de parto verdadero. °Verdadero trabajo de parto °· Las contracciones del verdadero  trabajo de parto duran de 30 a 70 segundos, son muy regulares y suelen volverse más intensas, y aumenta su frecuencia. °· No desaparecen cuando camina. °· La molestia generalmente se siente en la parte superior del útero y se extiende hacia la zona inferior del abdomen y hacia la cintura. °· El médico podrá examinarla para determinar si el trabajo de parto es verdadero. El examen mostrará si el cuello del útero se está dilatando y afinando. °LO QUE DEBE RECORDAR °· Continúe haciendo los ejercicios habituales y siga otras indicaciones que el médico le dé. °· Tome todos los medicamentos como le indicó el médico. °· Concurra a las visitas prenatales regulares. °· Coma y beba con moderación si cree que está en trabajo de parto. °· Si las contracciones de Braxton Hicks le provocan incomodidad: °¨ Cambie de posición: si está acostada o descansando, camine; si está caminando, descanse. °¨ Siéntese y descanse en una bañera con agua tibia. °¨ Beba 2 o 3 vasos de agua. La deshidratación puede provocar contracciones. °¨ Respire lenta y profundamente varias veces por hora. °¿CUÁNDO DEBO BUSCAR ASISTENCIA MÉDICA INMEDIATA? °Solicite atención médica de inmediato si: °· Las contracciones se intensifican, se hacen más regulares y cercanas entre sí. °· Tiene una pérdida de líquido por la vagina. °· Tiene fiebre. °· Elimina mucosidad manchada con sangre. °· Tiene una hemorragia vaginal abundante. °· Tiene dolor abdominal permanente. °· Tiene un dolor en la zona lumbar que nunca tuvo antes. °· Siente que la cabeza del bebé empuja hacia abajo y ejerce presión en la zona pélvica. °· El bebé no se mueve tanto como solía. °  °Esta información no tiene como fin   reemplazar el consejo del médico. Asegúrese de hacerle al médico cualquier pregunta que tenga. °  °Document Released: 11/03/2004 Document Revised: 01/29/2013 °Elsevier Interactive Patient Education ©2016 Elsevier Inc. ° °

## 2015-07-10 NOTE — MAU Note (Signed)
Patient presents with c/o having some vaginal bleeding this morning, none at present, small amount of pain with contractions.

## 2015-07-10 NOTE — MAU Provider Note (Signed)
  History     CSN: [id number]  Arrival date and time: 07/10/15 1409   First Provider Initiated Contact with Patient 07/10/15 1512      Chief Complaint  Patient presents with  . Vaginal Bleeding   HPI [name] is a 34yo [id number] @ 40.0wks who presents for eval of sm blood seen at home. Denies leaking fluid or vag discharge; having mild irreg ctx. Denies recent IC. [place] [facility] but she desires to deliver here.  OB History    Gravida Para Term Preterm AB TAB SAB Ectopic Multiple Living   3 2 2       2       Past Medical History  Diagnosis Date  . Medical history non-contributory     Past Surgical History  Procedure Laterality Date  . No past surgeries      History reviewed. No pertinent family history.  Social History  Substance Use Topics  . Smoking status: Never Smoker   . Smokeless tobacco: None  . Alcohol Use: No    Allergies: No Known Allergies  Prescriptions prior to admission  Medication Sig Dispense Refill Last Dose  . Prenatal Vit-Fe Fumarate-FA (MULTIVITAMIN-PRENATAL) 27-0.8 MG TABS tablet Take 1 tablet by mouth daily at 12 noon.   07/09/2015 at Unknown time    ROS Physical Exam   Blood pressure 125/79, pulse 77, temperature 98.2 F (36.8 C), resp. rate 18, height 5\' 4"  (1.626 m), weight 82.101 kg (181 lb).  Physical Exam  Constitutional: She is oriented to person, place, and time. She appears well-developed.  HENT:  Head: Normocephalic.  Neck: Normal range of motion.  Cardiovascular: Normal rate.   Respiratory: Effort normal.  GI:  EFM 150s, +accels, no decels Irreg mild ctx  Genitourinary:  Cx FT/long; no blood seen on exam  Musculoskeletal: Normal range of motion.  Neurological: She is alert and oriented to person, place, and time.  Skin: Skin is warm and dry.  Psychiatric: She has a normal mood and affect. Her behavior is normal. Thought content normal.   Urinalysis    Component Value  Date/Time   COLORURINE YELLOW 07/10/2015 1420   APPEARANCEUR CLEAR 07/10/2015 1420   LABSPEC <1.005* 07/10/2015 1420   PHURINE 7.0 07/10/2015 1420   GLUCOSEU NEGATIVE 07/10/2015 1420   HGBUR NEGATIVE 07/10/2015 1420   BILIRUBINUR NEGATIVE 07/10/2015 1420   KETONESUR NEGATIVE 07/10/2015 1420   PROTEINUR NEGATIVE 07/10/2015 1420   UROBILINOGEN 0.2 01/09/2010 1406   NITRITE NEGATIVE 07/10/2015 1420   LEUKOCYTESUR MODERATE* 07/10/2015 1420   Micro: SE 0-5, rare bacteria    MAU Course  Procedures  MDM UA ordered NST Cx exam  Assessment and Plan  IUP@term  Vaginal discharge  D/C home with labor/ROM/bleeding precautions F/U as scheduled w/ OB provider  Cam HaiSHAW, KIMBERLY CNM 07/10/2015, 3:25 PM

## 2015-07-11 ENCOUNTER — Inpatient Hospital Stay (HOSPITAL_COMMUNITY)
Admission: AD | Admit: 2015-07-11 | Discharge: 2015-07-13 | DRG: 775 | Disposition: A | Discharge status: Home / Self Care | Payer: Medicaid Other | Source: Ambulatory Visit | Attending: Family Medicine | Admitting: Family Medicine

## 2015-07-11 ENCOUNTER — Encounter (HOSPITAL_COMMUNITY): Payer: Self-pay | Admitting: *Deleted

## 2015-07-11 DIAGNOSIS — Z3A40 40 weeks gestation of pregnancy: Secondary | ICD-10-CM

## 2015-07-11 DIAGNOSIS — O99824 Streptococcus B carrier state complicating childbirth: Secondary | ICD-10-CM | POA: Diagnosis present

## 2015-07-11 DIAGNOSIS — IMO0002 Reserved for concepts with insufficient information to code with codable children: Secondary | ICD-10-CM | POA: Diagnosis not present

## 2015-07-11 LAB — ABO/RH: ABO/RH(D): O POS

## 2015-07-11 LAB — CBC
HEMATOCRIT: 33 % — AB (ref 36.0–46.0)
HEMOGLOBIN: 11.5 g/dL — AB (ref 12.0–15.0)
MCH: 30.3 pg (ref 26.0–34.0)
MCHC: 34.8 g/dL (ref 30.0–36.0)
MCV: 87.1 fL (ref 78.0–100.0)
Platelets: 195 10*3/uL (ref 150–400)
RBC: 3.79 MIL/uL — AB (ref 3.87–5.11)
RDW: 15.6 % — ABNORMAL HIGH (ref 11.5–15.5)
WBC: 12.2 10*3/uL — AB (ref 4.0–10.5)

## 2015-07-11 LAB — TYPE AND SCREEN
ABO/RH(D): O POS
ANTIBODY SCREEN: NEGATIVE

## 2015-07-11 LAB — OB RESULTS CONSOLE GBS: GBS: POSITIVE

## 2015-07-11 MED ORDER — LACTATED RINGERS IV SOLN: 500.0000 mL | INTRAVENOUS | Status: DC | PRN

## 2015-07-11 MED ORDER — LACTATED RINGERS IV SOLN
INTRAVENOUS | Status: DC
  Administered 2015-07-11: 20:00:00 via INTRAVENOUS

## 2015-07-11 MED ORDER — OXYTOCIN 40 UNITS IN LACTATED RINGERS INFUSION - SIMPLE MED
2.5000 [IU]/h | INTRAVENOUS | Status: DC
  Administered 2015-07-11: 2.5 [IU]/h via INTRAVENOUS

## 2015-07-11 MED ORDER — OXYCODONE-ACETAMINOPHEN 5-325 MG PO TABS: 2.0000 | ORAL_TABLET | ORAL | Status: DC | PRN

## 2015-07-11 MED ORDER — OXYTOCIN BOLUS FROM INFUSION: 500.0000 mL | INTRAVENOUS | Status: DC

## 2015-07-11 MED ORDER — LIDOCAINE HCL (PF) 1 % IJ SOLN
INTRAMUSCULAR | Status: AC
  Filled 2015-07-11: qty 30

## 2015-07-11 MED ORDER — OXYTOCIN 40 UNITS IN LACTATED RINGERS INFUSION - SIMPLE MED
INTRAVENOUS | Status: AC
  Filled 2015-07-11: qty 1000

## 2015-07-11 MED ORDER — SOD CITRATE-CITRIC ACID 500-334 MG/5ML PO SOLN: 30.0000 mL | ORAL | Status: DC | PRN

## 2015-07-11 MED ORDER — SODIUM CHLORIDE 0.9 % IV SOLN
2.0000 g | Freq: Once | INTRAVENOUS | Status: AC
  Administered 2015-07-11: 2 g via INTRAVENOUS
  Filled 2015-07-11: qty 2000

## 2015-07-11 MED ORDER — OXYCODONE-ACETAMINOPHEN 5-325 MG PO TABS: 1.0000 | ORAL_TABLET | ORAL | Status: DC | PRN

## 2015-07-11 MED ORDER — LIDOCAINE HCL (PF) 1 % IJ SOLN
30.0000 mL | INTRAMUSCULAR | Status: DC | PRN
  Administered 2015-07-11: 30 mL via SUBCUTANEOUS
  Filled 2015-07-11: qty 30

## 2015-07-11 MED ORDER — FENTANYL CITRATE (PF) 100 MCG/2ML IJ SOLN
100.0000 ug | INTRAMUSCULAR | Status: DC | PRN
  Administered 2015-07-11 (×2): 100 ug via INTRAVENOUS
  Filled 2015-07-11 (×2): qty 2

## 2015-07-11 MED ORDER — ONDANSETRON HCL 4 MG/2ML IJ SOLN: 4.0000 mg | Freq: Four times a day (QID) | INTRAMUSCULAR | Status: DC | PRN

## 2015-07-11 MED ORDER — ACETAMINOPHEN 325 MG PO TABS: 650.0000 mg | ORAL_TABLET | ORAL | Status: DC | PRN

## 2015-07-11 NOTE — H&P (Signed)
OBSTETRIC ADMISSION HISTORY AND PHYSICAL  Jodi Santos is a 35 y.o. female G3P2002 with IUP at 3973w1d by LMP presenting for spontaneous onset of labor. Contractions started at 5am this morning. She has had 3-4 episodes of mild vaginal bleeding today with some clots. She reports +FMs and peripheral edema. She denies LOF, blurry vision, headaches, and RUQ pain.  She plans on breast and bottle feeding. She will use OCPs for birth control.  Dating: By LMP --->  Estimated Date of Delivery: 07/10/15  Sono:    @ 5985w0d, CWD, normal anatomy, breech presentation, 241g, 74% EFW  Prenatal History/Complications: -GBS positive -[facility]  Past Medical History: Past Medical History  Diagnosis Date  . Medical history non-contributory     Past Surgical History: Past Surgical History  Procedure Laterality Date  . No past surgeries      Obstetrical History: OB History    Gravida Para Term Preterm AB TAB SAB Ectopic Multiple Living   3 2 2       2       Social History: Social History   Social History  . Marital Status: Married    Spouse Name: N/A  . Number of Children: N/A  . Years of Education: N/A   Social History Main Topics  . Smoking status: Never Smoker   . Smokeless tobacco: None  . Alcohol Use: No  . Drug Use: No  . Sexual Activity: Yes   Other Topics Concern  . None   Social History Narrative    Family History: No family history on file.  Allergies: No Known Allergies  Prescriptions prior to admission  Medication Sig Dispense Refill Last Dose  . Prenatal Vit-Fe Fumarate-FA (PRENATAL MULTIVITAMIN) TABS tablet Take 1 tablet by mouth daily.   07/10/2015 at Unknown time   Review of Systems   All systems reviewed and negative except as stated in HPI  Blood pressure 136/91, pulse 94, temperature 98 F (36.7 C), temperature source Oral, resp. rate 18. General appearance: alert and cooperative Lungs: clear to auscultation  bilaterally Heart: regular rate and rhythm Abdomen: soft, non-tender; bowel sounds normal Extremities: no sign of DVT, trace edema Presentation: cephalic Fetal monitoring: baseline 125 bpm, moderate variability, accelerations 15x15 and no decelerations Uterine activity: frequency every 2-4 minutes and duration 60-80 seconds Dilation: 8 Effacement (%): 100 Station: +1 Exam by:: LCarpenter,RN   Prenatal labs: ABO, Rh: --/--/O POS (06/03 2016) Antibody: PENDING (06/03 2016) Rubella: Immune (11/01 0000) RPR: Nonreactive (11/16 0000)  HBsAg: Negative (11/01 0000)  HIV: Non-reactive (11/01 0000)  GBS: Positive (06/03 0000)  1 hr Glucola: 121 Genetic screening: Normal Anatomy US: Normal  Prenatal Transfer Tool  Maternal Diabetes: No Genetic Screening: Normal Maternal Ultrasounds/Referrals: Normal Fetal Ultrasounds or other Referrals:  None Maternal Substance Abuse:  No Significant Maternal Medications:  None Significant Maternal Lab Results: Lab values include: Group B Strep positive  Results for orders placed or performed during the hospital encounter of 07/11/15 (from the past 24 hour(s))  OB RESULT CONSOLE Group B Strep   Collection Time: 07/11/15 12:00 AM  Result Value Ref Range   GBS Positive   CBC   Collection Time: 07/11/15  8:16 PM  Result Value Ref Range   WBC 12.2 (H) 4.0 - 10.5 K/uL   RBC 3.79 (L) 3.87 - 5.11 MIL/uL   Hemoglobin 11.5 (L) 12.0 - 15.0 g/dL   HCT 16.133.0 (L) 09.636.0 - 04.546.0 %   MCV 87.1 78.0 - 100.0 fL  MCH 30.3 26.0 - 34.0 pg   MCHC 34.8 30.0 - 36.0 g/dL   RDW 16.1 (H) 09.6 - 04.5 %   Platelets 195 150 - 400 K/uL  Type and screen [facility]   Collection Time: 07/11/15  8:16 PM  Result Value Ref Range   ABO/RH(D) O POS    Antibody Screen PENDING    Sample Expiration 07/14/2015     Patient Active Problem List   Diagnosis Date Noted  . Active labor 07/11/2015    Assessment/Plan:  Jodi Santos is a 35 y.o.  G3P2002 at [redacted]w[redacted]d here for spontaneous onset of labor.  #Labor: Expectant management #Pain: PRN medications #FWB: Cat I #ID: GBS positive, started ampicillin #MOF: Breast and bottle #MOC: OCP #Circ: declines  This information was collected with the help of MS3 Jodi Skeans, MD , MPH, ABFM Family Medicine, OB Fellow [facility]

## 2015-07-11 NOTE — MAU Note (Signed)
Reports ctx on and off all afternoon. Reports some bloody show and mucsy discharge.

## 2015-07-12 ENCOUNTER — Encounter (HOSPITAL_COMMUNITY): Payer: Self-pay | Admitting: *Deleted

## 2015-07-12 DIAGNOSIS — IMO0002 Reserved for concepts with insufficient information to code with codable children: Secondary | ICD-10-CM

## 2015-07-12 DIAGNOSIS — Z3A40 40 weeks gestation of pregnancy: Secondary | ICD-10-CM

## 2015-07-12 DIAGNOSIS — O99824 Streptococcus B carrier state complicating childbirth: Secondary | ICD-10-CM

## 2015-07-12 LAB — RPR: RPR Ser Ql: NONREACTIVE

## 2015-07-12 MED ORDER — ONDANSETRON HCL 4 MG/2ML IJ SOLN: 4.0000 mg | INTRAMUSCULAR | Status: DC | PRN

## 2015-07-12 MED ORDER — ONDANSETRON HCL 4 MG PO TABS: 4.0000 mg | ORAL_TABLET | ORAL | Status: DC | PRN

## 2015-07-12 MED ORDER — OXYCODONE-ACETAMINOPHEN 5-325 MG PO TABS: 1.0000 | ORAL_TABLET | ORAL | Status: DC | PRN

## 2015-07-12 MED ORDER — WITCH HAZEL-GLYCERIN EX PADS: 1.0000 "application " | MEDICATED_PAD | CUTANEOUS | Status: DC | PRN

## 2015-07-12 MED ORDER — OXYCODONE-ACETAMINOPHEN 5-325 MG PO TABS: 2.0000 | ORAL_TABLET | ORAL | Status: DC | PRN

## 2015-07-12 MED ORDER — DIBUCAINE 1 % RE OINT: 1.0000 "application " | TOPICAL_OINTMENT | RECTAL | Status: DC | PRN

## 2015-07-12 MED ORDER — DOCUSATE SODIUM 100 MG PO CAPS
100.0000 mg | ORAL_CAPSULE | Freq: Two times a day (BID) | ORAL | Status: DC
  Administered 2015-07-13: 100 mg via ORAL
  Filled 2015-07-12: qty 1

## 2015-07-12 MED ORDER — ACETAMINOPHEN 325 MG PO TABS
650.0000 mg | ORAL_TABLET | ORAL | Status: DC | PRN
Start: 2015-07-12 — End: 2015-07-13

## 2015-07-12 MED ORDER — IBUPROFEN 600 MG PO TABS
600.0000 mg | ORAL_TABLET | Freq: Four times a day (QID) | ORAL | Status: DC
  Administered 2015-07-12 – 2015-07-13 (×6): 600 mg via ORAL
  Filled 2015-07-12 (×6): qty 1

## 2015-07-12 MED ORDER — DIPHENHYDRAMINE HCL 25 MG PO CAPS: 25.0000 mg | ORAL_CAPSULE | Freq: Four times a day (QID) | ORAL | Status: DC | PRN

## 2015-07-12 MED ORDER — SIMETHICONE 80 MG PO CHEW: 80.0000 mg | CHEWABLE_TABLET | ORAL | Status: DC | PRN

## 2015-07-12 MED ORDER — PRENATAL MULTIVITAMIN CH
1.0000 | ORAL_TABLET | Freq: Every day | ORAL | Status: DC
  Administered 2015-07-12 – 2015-07-13 (×2): 1 via ORAL
  Filled 2015-07-12 (×2): qty 1

## 2015-07-12 MED ORDER — PENICILLIN G POTASSIUM 5000000 UNITS IJ SOLR: 2.5000 10*6.[IU] | INTRAVENOUS | Status: DC

## 2015-07-12 MED ORDER — COCONUT OIL OIL: 1.0000 "application " | TOPICAL_OIL | Status: DC | PRN

## 2015-07-12 MED ORDER — BENZOCAINE-MENTHOL 20-0.5 % EX AERO
1.0000 "application " | INHALATION_SPRAY | CUTANEOUS | Status: DC | PRN
  Administered 2015-07-13: 1 via TOPICAL
  Filled 2015-07-12: qty 56

## 2015-07-12 MED ORDER — PENICILLIN G POTASSIUM 5000000 UNITS IJ SOLR: 5.0000 10*6.[IU] | Freq: Once | INTRAMUSCULAR | Status: DC

## 2015-07-12 NOTE — Progress Notes (Signed)
Assisted RN with interpretation of admission of patient to L&D unit.  Spanish Interpreter

## 2015-07-12 NOTE — Lactation Note (Signed)
This note was copied from a baby's chart. Lactation Consultation Note Initial visit made with Trinna PostAlex Spanish interpreter present. Mom reports baby has just finished feeding for 5 min. Reports he is not getting enough and still acts fussy after nursing. Asking for formula. Baby asleep at mom's side. Encouraged to wait until he wakes and always breast feed first to promote a good milk supply. Spanish BF brochure given. No questions at present. Encouraged to call for assist prn  Patient Name: Boy Gean Quintrnestina Nolasco-Galeana WGNFA'OToday's Date: 07/12/2015 Reason for consult: Initial assessment   Maternal Data Formula Feeding for Exclusion: Yes Reason for exclusion: Mother's choice to formula and breast feed on admission Does the patient have breastfeeding experience prior to this delivery?: Yes  Feeding    LATCH Score/Interventions                      Lactation Tools Discussed/Used     Consult Status Consult Status: Follow-up Date: 07/13/15 Follow-up type: In-patient    Pamelia HoitWeeks, Jennine Peddy D 07/12/2015, 11:12 AM

## 2015-07-12 NOTE — Progress Notes (Signed)
Post Partum Day 1 Subjective:  Jodi Santos is a 35 y.o. [id number] 18103w1d s/p spontaneous vaginal delivery.  No acute events overnight.  Pt denies nausea or vomiting.  Pain is well controlled.  She is voiding and has had flatus.  Lochia Minimal.  Plan for birth control is oral contraceptives.  Method of Feeding: breast and bottle.  Objective: Blood pressure 123/68, pulse 64, temperature 98.4 F (36.9 C), temperature source Oral, resp. rate 18, SpO2 98 %, unknown if currently breastfeeding.  Physical Exam:  General: alert, cooperative and no distress Chest: normal WOB Heart: Regular rate Abdomen: soft, mild TTP (appropriate) DVT Evaluation: no evidence of DVT seen on physical exam. Extremities: trace edema   Recent Labs  07/11/15 2016  HGB 11.5*  HCT 33.0*    Assessment/Plan:  ASSESSMENT: Jodi Santos is a 35 y.o. [id number] 52103w1d s/p spontaneous vaginal delivery. Patient was not adequately treated for positive GBS, so she will stay for two days.  #Postpartum Care Continue routine PP care Breastfeeding support PRN  OB fellow attestation Post Partum Day 1 I have seen and examined this patient and agree with above documentation in the medical student's note.   Jodi Santos is a 35 y.o. [id number] s/p NSVD.  Pt denies problems with ambulating, voiding or po intake. Pain is well controlled.  Plan for birth control is oral contraceptives (estrogen/progesterone).  Method of Feeding: breast. Delivery complicated by shoulder dystocia  PE:  BP 123/68 mmHg  Pulse 64  Temp(Src) 98.4 F (36.9 C) (Oral)  Resp 18  SpO2 98%  Breastfeeding? Unknown Gen: well appearing Heart: reg rate Lungs: normal WOB Fundus firm Ext: soft, no pain, no edema  Plan for discharge: PPD#2 ROutine care Continue current plan  Federico FlakeKimberly Niles Khaleel Beckom, MD 9:43 AM

## 2015-07-12 NOTE — Progress Notes (Signed)
Assisted with interpretation of registration and triage. Spanish Interpreter

## 2015-07-12 NOTE — Progress Notes (Signed)
Checked on patients needs.  Also ordered patients meals.  Spanish Interpreter

## 2015-07-13 MED ORDER — IBUPROFEN 600 MG PO TABS: 600.0000 mg | ORAL_TABLET | Freq: Four times a day (QID) | ORAL | Status: AC

## 2015-07-13 NOTE — Lactation Note (Signed)
This note was copied from a baby's chart. Lactation Consultation Note: Spanish Interpreter ID [id number], on phone for all teaching. Mother states she doesn't have enough milk. Mother reassured that her milk will come in within the next 24-48 hours. Mother request formula . She is bottle feeding when I left the room. Infant is taking formula well. Mother advised to breastfeed infant before bottle  to prevent nipple confusion. Mother advised in treatment to prevent severe engorgement. Mother receptive to all teaching. Mother has harmony hand pump at the bedside.  Mother is active with WIC.  Patient Name: Jodi Santos ZOXWR'UToday's Date: 07/13/2015 Reason for consult: Follow-up assessment   Maternal Data    Feeding Feeding Type: Bottle Fed - Formula Length of feed: 10 min  LATCH Score/Interventions Latch: Grasps breast easily, tongue down, lips flanged, rhythmical sucking.  Audible Swallowing: Spontaneous and intermittent  Type of Nipple: Everted at rest and after stimulation  Comfort (Breast/Nipple): Filling, red/small blisters or bruises, mild/mod discomfort     Hold (Positioning): No assistance needed to correctly position infant at breast.  LATCH Score: 9  Lactation Tools Discussed/Used     Consult Status Consult Status: Follow-up Date: 07/13/15 Follow-up type: In-patient    Stevan BornKendrick, [place] [facility] 07/13/2015, 2:33 PM

## 2015-07-13 NOTE — Discharge Summary (Signed)
        OB Discharge Summary  Patient Name: Jodi Santos DOB: [birth year] MRN: [id number]  Date of admission: 07/11/2015 Delivering MD:     Date of discharge: 07/13/2015  Admitting diagnosis: 40 WKS, BLEEDING Intrauterine pregnancy: 4619w1d     Secondary diagnosis:Principal Problem:   NSVD (normal spontaneous vaginal delivery) Active Problems:   Obstetrical laceration, first degree   Shoulder dystocia  Additional problems:none     Discharge diagnosis: Term Pregnancy Delivered and shoulder dystocia                                                                     Post partum procedures:none  Augmentation: Pitocin  Complications: None  Hospital course:  Onset of Labor With Vaginal Delivery     35 y.o. yo G3P3003 at 1619w1d was admitted in Active Labor on 07/11/2015. Patient had an uncomplicated labor course as follows:  Membrane Rupture Time/Date: 10:15 PM ,07/11/2015   Intrapartum Procedures: Episiotomy: None [1]                                         Lacerations:  1st degree [2];Perineal [11]  Patient had a delivery of a Viable infant. 07/11/2015  Information for the patient's newborn:  Jodi Santos, Boy Natara [[id number]][[id number]]  Delivery Method: Vag-Spont    Pateint had an uncomplicated postpartum course.  She is ambulating, tolerating a regular diet, passing flatus, and urinating well. Patient is discharged home in stable condition on 07/13/2015.    Physical exam  Filed Vitals:   07/12/15 0200 07/12/15 0545 07/12/15 1845 07/13/15 0531  BP: 114/60 123/68 105/66 104/65  Pulse: 73 64 72 68  Temp: 98.1 F (36.7 C) 98.4 F (36.9 C) 98.6 F (37 C) 97.9 F (36.6 C)  TempSrc: Oral Oral Oral Oral  Resp: 18 18 18 18   SpO2: 99% 98% 96%    General: alert, cooperative and no distress Lochia: appropriate Uterine Fundus: firm Incision: N/A DVT Evaluation: No evidence of DVT seen on physical exam. Negative Homan's sign. No cords or calf tenderness. Labs: Lab  Results  Component Value Date   WBC 12.2* 07/11/2015   HGB 11.5* 07/11/2015   HCT 33.0* 07/11/2015   MCV 87.1 07/11/2015   PLT 195 07/11/2015   No flowsheet data found.  Discharge instruction: per After Visit Summary and "Baby and Me Booklet".  After Visit Meds:    Medication List    ASK your doctor about these medications        prenatal multivitamin Tabs tablet  Take 1 tablet by mouth daily.        Diet: routine diet  Activity: Advance as tolerated. Pelvic rest for 6 weeks.   Outpatient follow up:6 weeks Follow up Appt:No future appointments. Follow up visit: No Follow-up on file.  Postpartum contraception: Combination OCPs  Newborn Data: Live born female  Birth Weight: 8 lb 4.1 oz (3745 g) APGAR: 8, 9  Baby Feeding: Bottle and Breast Disposition:home with mother   07/13/2015 Jodi Santos, CNM

## 2015-07-14 ENCOUNTER — Ambulatory Visit: Payer: Self-pay

## 2015-07-14 NOTE — Lactation Note (Signed)
This note was copied from a baby's chart. Lactation Consultation Note  Spanish Interpreter [phone]#[id number] used for spanish. Mother states she only has a little bit of breastmilk.  Described supply and demand and stomach size. Reminded her to breastfeed on demand and before giving formula. Reviewed engorgement care and monitoring voids/stools.   Patient Name: Jodi Gean Quintrnestina Nolasco-Galeana EAVWU'JToday's Date: 07/14/2015     Maternal Data    Feeding Feeding Type: Bottle Fed - Formula Nipple Type: Slow - flow Length of feed: 25 min  LATCH Score/Interventions Latch: Grasps breast easily, tongue down, lips flanged, rhythmical sucking.  Audible Swallowing: Spontaneous and intermittent  Type of Nipple: Everted at rest and after stimulation  Comfort (Breast/Nipple): Soft / non-tender     Hold (Positioning): No assistance needed to correctly position infant at breast.  LATCH Score: 10  Lactation Tools Discussed/Used     Consult Status      Dahlia ByesBerkelhammer, Ruth [facility] 07/14/2015, 11:30 AM

## 2015-07-27 ENCOUNTER — Encounter (HOSPITAL_COMMUNITY): Payer: Self-pay | Admitting: *Deleted

## 2019-08-15 ENCOUNTER — Other Ambulatory Visit (HOSPITAL_COMMUNITY)
Admission: RE | Admit: 2019-08-15 | Discharge: 2019-08-15 | Disposition: A | Discharge status: Home / Self Care | Payer: Self-pay | Source: Ambulatory Visit | Attending: Medical | Admitting: Medical

## 2019-08-15 ENCOUNTER — Other Ambulatory Visit: Payer: Self-pay

## 2019-08-15 ENCOUNTER — Ambulatory Visit: Payer: Self-pay | Admitting: *Deleted

## 2019-08-15 ENCOUNTER — Encounter: Payer: Self-pay | Admitting: Medical

## 2019-08-15 ENCOUNTER — Ambulatory Visit (INDEPENDENT_AMBULATORY_CARE_PROVIDER_SITE_OTHER): Payer: Self-pay | Admitting: Medical

## 2019-08-15 VITALS — BP 147/95 | HR 77 | Ht 64.0 in | Wt 153.1 lb

## 2019-08-15 VITALS — BP 135/94 | Temp 97.8°F | Wt 153.1 lb

## 2019-08-15 DIAGNOSIS — Z1239 Encounter for other screening for malignant neoplasm of breast: Secondary | ICD-10-CM

## 2019-08-15 DIAGNOSIS — R87622 Low grade squamous intraepithelial lesion on cytologic smear of vagina (LGSIL): Secondary | ICD-10-CM

## 2019-08-15 DIAGNOSIS — Z3202 Encounter for pregnancy test, result negative: Secondary | ICD-10-CM

## 2019-08-15 DIAGNOSIS — R87612 Low grade squamous intraepithelial lesion on cytologic smear of cervix (LGSIL): Secondary | ICD-10-CM | POA: Insufficient documentation

## 2019-08-15 DIAGNOSIS — N870 Mild cervical dysplasia: Secondary | ICD-10-CM

## 2019-08-15 LAB — POCT PREGNANCY, URINE: Preg Test, Ur: NEGATIVE

## 2019-08-15 NOTE — Patient Instructions (Signed)
Explained breast self awareness with Jodi Santos. Patient did not need a Pap smear today due to last Pap smear was 06/25/2019. Explained the colposcopy the recommended follow-up for her abnormal Pap smear. Referred patient to the Center for [facility] for a colposcopy to follow-up for her abnormal Pap smear. Appointment scheduled Thursday, August 15, 2019 at 1455. Patient aware of appointment and will be there. Let patient know a screening mammogram is recommended at age 76 unless clinically indicated prior. Jodi Santos verbalized understanding.  Jamarco Zaldivar, Kathaleen Maser, RN 2:47 PM

## 2019-08-15 NOTE — Progress Notes (Signed)
[name] is a 39 y.o. female who presents to [facility] clinic today with no complaints. Patient was referred to [facility] by the [facility] Department due to having an abnormal Pap smear 06/25/2019 that a colposcopy is recommended for follow-up.   Pap Smear: Pap not smear completed today. Last Pap smear was 06/25/2019 at the [facility] clinic and was LSIL with positive HPV. Per patient has a history of one other abnormal Pap smear around 6 years ago that a colposcopy and LEEP were completed for follow-up. Patient stated that all Pap smears have been normal since LEEP and that she has had at least three in a row. Last Pap smear result is available in Epic.   Physical exam: Breasts Breasts symmetrical. No skin abnormalities bilateral breasts. No nipple retraction bilateral breasts. No nipple discharge bilateral breasts. No lymphadenopathy. No lumps palpated bilateral breasts. Screening mammogram recommended at age 75 unless clinically indicated prior.       Pelvic/Bimanual Pap is not indicated today per BCCCP guidelines.   Smoking History: Patient is a former smoker that quit over 6 months ago and stated she only smoked occasionally.   Patient Navigation: Patient education provided. Access to services provided for patient through [facility] program. Spanish interpreter Maretta Los from [facility] provided.    Breast and Cervical Cancer Risk Assessment: Patient does not have family history of breast cancer, known genetic mutations, or radiation treatment to the chest before age 22. Patient has history of cervical dysplasia. Patient has no history of being immunocompromised or DES exposure in-utero.  Risk Assessment    Risk Scores      08/15/2019   Last edited by: Meryl Dare, CMA   5-year risk: 0.2 %   Lifetime risk: 7.5 %          A: BCCCP exam without pap smear.  P: Referred patient to the Center for [facility] for a colposcopy to  follow-up for her abnormal Pap smear. Appointment scheduled Thursday, August 15, 2019 at 1455.  Priscille Heidelberg, RN 08/15/2019 2:47 PM

## 2019-08-15 NOTE — Progress Notes (Signed)
    GYNECOLOGY CLINIC COLPOSCOPY PROCEDURE NOTE  [name] is a 39 y.o. 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 The patient was referred to our office for colposcopy from [facility]. Discussed role for HPV in cervical dysplasia, need for surveillance. Patient is not a smoker. She states a remote history of abnormal pap smear requiring colposcopy and no further treatment was recommended at that time.   Results for orders placed or performed in visit on 08/15/19 (from the past 24 hour(s))  Pregnancy, urine POC     Status: None   Collection Time: 08/15/19  3:38 PM  Result Value Ref Range   Preg Test, Ur NEGATIVE NEGATIVE    Patient given informed consent, signed copy in the chart, time out was performed.  Placed in lithotomy position. Cervix viewed with speculum and colposcope after application of acetic acid.   Colposcopy adequate? Yes Acetowhite lesion(s) noted at 4 o'clock; biopsies obtained.  ECC specimen obtained. All specimens were labelled and sent to pathology.   Patient was given post procedure instructions.  Will follow up pathology and manage accordingly.  Routine preventative health maintenance measures emphasized.   Marny Lowenstein, PA-C 08/15/2019 4:10 PM

## 2019-08-15 NOTE — Patient Instructions (Signed)
Colposcopía, cuidados posteriores °Colposcopy, Care After °Lea esta información sobre cómo cuidarse después del procedimiento. El médico también podrá darle indicaciones más específicas. Si tiene problemas o preguntas, llame al médico. °¿Qué puedo esperar después del procedimiento? °Si no le han extraído una muestra de tejido (no le realizaron una biopsia), es posible que solo presente unas manchas durante algunos días. Puede reanudar sus actividades habituales. °Si le extrajeron una muestra de tejido, es frecuente que tenga lo siguiente: °· Sensibilidad y dolor. Esto puede durar algunos días. °· Sensación de desvanecimiento. °· Sangrado leve de la vagina o secreción granulada de color oscuro de la vagina. Esto puede durar algunos días. Es posible que deba usar un apósito sanitario. °· Manchas durante al menos 48 horas después del procedimiento. °Siga estas indicaciones en su casa: ° °· Tome los medicamentos de venta libre y los recetados solamente como se lo haya indicado el médico. Pregúntele al médico qué medicamentos puede comenzar a tomar nuevamente. Esto es muy importante si toma anticoagulantes. °· No conduzca ni use maquinaria pesada mientras toma analgésicos recetados. °· Durante 3 días, o durante el tiempo que le indique el médico, evite lo siguiente: °? Las duchas vaginales. °? Los tampones. °? Las relaciones sexuales. °· Si usa un método anticonceptivo, continúe usándolo. °· Limite la actividad durante el primer día posterior al procedimiento. Pregúntele al médico qué actividades son seguras para usted. °· Es su responsabilidad retirar los resultados del procedimiento. Pregúntele al médico la fecha en que los resultados estarán disponibles. °· Concurra a todas las visitas de control como se lo haya indicado el médico. Esto es importante. °Comuníquese con un médico si: °· Aparece una erupción cutánea. °Solicite ayuda de inmediato si: °· Tiene mucho sangrado de la vagina. Mucho sangrado es si usa más de un  apósito sanitario por hora durante 2 horas seguidas. °· Elimina grumos de sangre (coágulos de sangre) por la vagina. °· Tiene fiebre. °· Tiene escalofríos. °· Tiene dolor en la parte baja del vientre (zona pélvica). °· Tiene signos de infección, como secreción vaginal que: °? Es diferente de la habitual. °? Es de color amarillo. °? Tiene mal olor. °· Tiene mucho dolor o cólicos abdominales en la parte baja del vientre que no se alivian con medicamentos. °· Tiene sensación de desvanecimiento. °· Siente mareos. °· Pierde el conocimiento (se desmaya). °Resumen °· Si no le han extraído una muestra de tejido (no le realizaron una biopsia), es posible que solo presente unas manchas durante algunos días. Puede reanudar sus actividades habituales. °· Si le extrajeron una muestra de tejido, es frecuente tener dolor leve y manchas durante 48 horas. °· Durante 3 días, o durante el tiempo que le indique el médico, evite las duchas vaginales, los tampones y las relaciones sexuales. °· Busque ayuda de inmediato si presenta sangrado, mucho dolor intenso o signos de infección. °Esta información no tiene como fin reemplazar el consejo del médico. Asegúrese de hacerle al médico cualquier pregunta que tenga. °Document Revised: 09/02/2016 Document Reviewed: 08/23/2012 °Elsevier Patient Education © 2020 [employer] Inc. ° °

## 2019-08-19 LAB — SURGICAL PATHOLOGY

## 2019-08-20 ENCOUNTER — Telehealth (INDEPENDENT_AMBULATORY_CARE_PROVIDER_SITE_OTHER): Payer: Self-pay | Admitting: General Practice

## 2019-08-20 DIAGNOSIS — N871 Moderate cervical dysplasia: Secondary | ICD-10-CM

## 2019-08-20 NOTE — Telephone Encounter (Signed)
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 Please inform patient with interpreter.   Vonzella Nipple, PA-C 08/20/2019 10:50 AM

## 2019-08-20 NOTE — Telephone Encounter (Signed)
Called patient with pacific interpreter #[id number] & informed her of results/follow up recommendation. Patient verbalized understanding. 

## 2023-01-25 ENCOUNTER — Telehealth: Payer: Self-pay

## 2023-01-25 NOTE — Telephone Encounter (Signed)
Telephoned patient at mobile number using interpreter#[phone]. Left a voice message with BCCCP contact information.

## 2023-06-20 ENCOUNTER — Other Ambulatory Visit: Payer: Self-pay | Admitting: Obstetrics & Gynecology

## 2023-06-20 DIAGNOSIS — Z1231 Encounter for screening mammogram for malignant neoplasm of breast: Secondary | ICD-10-CM

## 2023-07-06 ENCOUNTER — Ambulatory Visit
Admission: RE | Admit: 2023-07-06 | Discharge: 2023-07-06 | Disposition: A | Discharge status: Home / Self Care | Payer: Self-pay | Source: Ambulatory Visit | Attending: Obstetrics & Gynecology | Admitting: Obstetrics & Gynecology

## 2023-07-06 DIAGNOSIS — Z1231 Encounter for screening mammogram for malignant neoplasm of breast: Secondary | ICD-10-CM

## 2023-07-13 ENCOUNTER — Other Ambulatory Visit: Payer: Self-pay | Admitting: Obstetrics & Gynecology

## 2023-07-13 DIAGNOSIS — R928 Other abnormal and inconclusive findings on diagnostic imaging of breast: Secondary | ICD-10-CM

## 2023-07-14 ENCOUNTER — Other Ambulatory Visit: Payer: Self-pay

## 2023-07-14 DIAGNOSIS — R928 Other abnormal and inconclusive findings on diagnostic imaging of breast: Secondary | ICD-10-CM

## 2023-07-20 ENCOUNTER — Ambulatory Visit: Payer: Self-pay | Admitting: *Deleted

## 2023-07-20 VITALS — BP 130/106 | Ht 62.0 in | Wt 164.0 lb

## 2023-07-20 DIAGNOSIS — Z01419 Encounter for gynecological examination (general) (routine) without abnormal findings: Secondary | ICD-10-CM

## 2023-07-20 NOTE — Patient Instructions (Addendum)
 Explained breast self awareness with Jodi Santos. Pap smear completed today. Let her know that based on her history of abnormal Pap smears that her next Pap smear will be due based on the result of today's Pap smear. Referred patient to the [facility] for a diagnostic mammogram per recommendation. Appointment scheduled Friday, July 28, 2023 at 0950. Patient aware of appointment and will be there. Let patient know will follow up with her within the next couple weeks with results of Pap smear by letter or phone. Ja Santos verbalized understanding.  Zared Knoth, Dela Favor, RN 10:02 AM

## 2023-07-20 NOTE — Progress Notes (Addendum)
 Jodi Santos is a 43 y.o. G66P3001 female who presents to [facility] clinic today with no complaints. Patient had a screening mammogram completed 07/06/2023 that additional imaging of the right breast is recommended for follow up.   Pap Smear: Pap smear completed today. Last Pap smear was in 2023 at the [facility] clinic and was normal per patient. 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 Per patient thinks she has had two normal Pap smears since last abnormal Pap smear at the [facility] Department including her most recent Pap smear. Last Pap smear result is not available in Epic. Will follow up with the [facility] [facility] Department on last Pap smear result.   Physical exam: Breasts Breasts symmetrical. No skin abnormalities bilateral breasts. No nipple retraction bilateral breasts. No nipple discharge bilateral breasts. No lymphadenopathy. No lumps palpated bilateral breasts. No complaints of pain or tenderness on exam.  MS 3D SCR MAMMO BILAT BR (aka MM) Result Date: 07/12/2023 CLINICAL DATA:  Screening. EXAM: DIGITAL SCREENING BILATERAL MAMMOGRAM WITH TOMOSYNTHESIS AND CAD TECHNIQUE: Bilateral screening digital craniocaudal and mediolateral oblique mammograms were obtained. Bilateral screening digital breast tomosynthesis was performed. The images were evaluated with computer-aided detection. COMPARISON:  None.  Baseline study. ACR Breast Density Category c: The breasts are heterogeneously dense, which may obscure small masses. FINDINGS: In the right breast, a possible mass warrants further evaluation. In the left breast, no findings suspicious for malignancy. IMPRESSION: Further evaluation is suggested for a possible mass in the right breast.  RECOMMENDATION: Diagnostic mammogram and possibly ultrasound of the right breast. (Code:[id number]) The patient will be contacted regarding the findings, and additional imaging will be scheduled. BI-RADS CATEGORY  0: Incomplete: Need additional imaging evaluation. Electronically Signed   By: Amanda Jungling M.D.   On: 07/12/2023 08:36        Pelvic/Bimanual Ext Genitalia No lesions, no swelling and no discharge observed on external genitalia.        Vagina Vagina pink and normal texture. No lesions or discharge observed in vagina.        Cervix Cervix is present. Cervix pink and of normal texture. No discharge observed. Cervix friable.    Uterus Uterus is present and palpable. Uterus in normal position and normal size.        Adnexae Bilateral ovaries present and palpable. No tenderness on palpation.         Rectovaginal No rectal exam completed today since patient had no rectal complaints. No skin abnormalities observed on exam.     Smoking History: Patient is a former smoker that quit in 2021 and stated she only smoked occasionally.    Patient Navigation: Patient education provided. Access to services provided for patient through [place] program. Spanish interpreter Jodi Santos from [facility] provided. Patient has food insecurities. Patient escorted to the food market at the [facility] for groceries.   Breast and Cervical Cancer Risk Assessment: Patient does not have family history of breast cancer, known genetic mutations, or radiation treatment to the chest before age 70. Patient has history of cervical dysplasia. Patient has no history of being immunocompromised or DES exposure in-utero.   Risk Scores as of Encounter on 07/20/2023     Jodi Santos  5-year 0.21%   Lifetime 4.65%            Last calculated by Silas, Ansyi K, CMA on 07/20/2023 at  9:52 AM        A: BCCCP exam with pap smear No complaints.  P: Referred patient to the [facility] for a diagnostic mammogram per recommendation. Appointment scheduled Friday, July 28, 2023 at 0950.  Stefan Edge, RN 07/20/2023 10:02 AM

## 2023-07-25 LAB — CYTOLOGY - PAP
Comment: NEGATIVE
Diagnosis: NEGATIVE
Diagnosis: REACTIVE
High risk HPV: NEGATIVE

## 2023-07-26 ENCOUNTER — Ambulatory Visit: Payer: Self-pay

## 2023-07-28 ENCOUNTER — Ambulatory Visit
Admission: RE | Admit: 2023-07-28 | Discharge: 2023-07-28 | Disposition: A | Discharge status: Home / Self Care | Source: Ambulatory Visit | Attending: Obstetrics & Gynecology | Admitting: Obstetrics & Gynecology

## 2023-07-28 DIAGNOSIS — R928 Other abnormal and inconclusive findings on diagnostic imaging of breast: Secondary | ICD-10-CM

## 2024-01-22 ENCOUNTER — Other Ambulatory Visit: Payer: Self-pay

## 2024-01-22 DIAGNOSIS — Z36 Encounter for antenatal screening for chromosomal anomalies: Secondary | ICD-10-CM

## 2024-03-14 ENCOUNTER — Ambulatory Visit

## 2024-03-14 ENCOUNTER — Ambulatory Visit: Payer: Self-pay | Admitting: Obstetrics

## 2024-03-14 ENCOUNTER — Ambulatory Visit: Payer: Self-pay

## 2024-03-14 VITALS — BP 119/70 | HR 86

## 2024-03-14 DIAGNOSIS — D573 Sickle-cell trait: Secondary | ICD-10-CM

## 2024-03-14 DIAGNOSIS — O09522 Supervision of elderly multigravida, second trimester: Secondary | ICD-10-CM | POA: Insufficient documentation

## 2024-03-14 DIAGNOSIS — Z3A19 19 weeks gestation of pregnancy: Secondary | ICD-10-CM

## 2024-03-14 DIAGNOSIS — O99012 Anemia complicating pregnancy, second trimester: Secondary | ICD-10-CM

## 2024-03-14 DIAGNOSIS — Z36 Encounter for antenatal screening for chromosomal anomalies: Secondary | ICD-10-CM

## 2024-03-14 DIAGNOSIS — O280 Abnormal hematological finding on antenatal screening of mother: Secondary | ICD-10-CM

## 2024-03-14 DIAGNOSIS — O289 Unspecified abnormal findings on antenatal screening of mother: Secondary | ICD-10-CM

## 2024-03-14 NOTE — Progress Notes (Signed)
 MFM Consult Note  Jodi Santos is currently at [redacted]w[redacted]d. She was seen today for a detailed fetal anatomy scan due to advanced maternal age (44 years old).  She denies any significant past medical history and denies any problems in her current pregnancy.    She had a cell free DNA test earlier in her pregnancy which indicated a low risk for trisomy 11, 50, and 13.  The patient did not want the fetal gender revealed today.  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  Sonographic findings Single intrauterine pregnancy at 19w 1d. Fetal cardiac activity:  Observed and appears normal. Presentation: Cephalic. The views of the fetal anatomy were limited today due to the fetal position.  However what was visualized today appeared within normal limits. Fetal biometry shows the estimated fetal weight of 0 lb 11 oz,  309 grams (79%). Amniotic fluid: Within normal limits.  MVP: 2.96 cm. Placenta: Posterior Fundal. Adnexa: No abnormality visualized. Cervical length: 3.4 cm.  The patient was informed that anomalies may be missed due to technical limitations. If the fetus is in a suboptimal position or maternal habitus is increased, visualization of the fetus in the maternal uterus may be impaired.  Advanced maternal age  The increased risk of fetal aneuploidy due to advanced maternal age was discussed.   Due to advanced maternal age, the patient was offered and declined an amniocentesis today for definitive diagnosis of fetal aneuploidy. She is comfortable with the low risk indicated by her cell free DNA test.  As she is over 28 years old, we will continue to follow her with growth ultrasounds throughout her pregnancy.    Weekly fetal testing should be started at 34 weeks and continued until delivery.  Delivery is recommended at around 39 weeks.  She was advised to continue taking a daily baby aspirin (81 mg) throughout her pregnancy for  preeclampsia prophylaxis.  Carrier for sickle cell trait  The patient was advised that sickle cell disease is inherited in an autosomal recessive fashion, meaning that the child needs to inherit 2 copies of the mutated gene, one from each parent, to develop the condition.   She was advised to have her husband tested to determine if he is also a carrier for this condition.    Should he screen negative for sickle cell trait their child should be at low risk for inheriting this condition.  Should both parents be carriers for this condition, their child may have a 25% chance of inheriting the disease.    Should both parents be identified as carriers for this condition, she understands that an amniocentesis is available for definitive prenatal diagnosis.   The patient is meeting with our genetic counselor following today's ultrasound exam and will consider getting her husband screened at that time.    A follow-up exam was scheduled in 5 weeks.   All conversations were held with the patient today with the help of a Spanish interpreter.  The patient stated that all of her questions were answered.   A total of 45 minutes was spent counseling and coordinating the care for this patient.  Greater than 50% of the time was spent in direct face-to-face contact.

## 2024-03-14 NOTE — Progress Notes (Signed)
 "    [facility] at [facility] [address], [address] Phone:  [phone]   Fax:  [phone]      In-Person Genetic Counseling Clinic Note:   I spoke with 44 y.o. Jodi Santos today to discuss her carrier screening results. She was referred by Department, [facility]. She was accompanied by her husband [place]. An in-person Spanish-speaking interpreter was present.   Pregnancy History:    [id number]. EGA: [redacted]w[redacted]d by LMP. EDD: 08/07/2024. Misaki has three healthy sons, two of which are twins. Her daughter passed away from lupus at 17 yo. Denies major personal health concerns. Denies bleeding, infections, and fevers in this pregnancy. Denies using tobacco, alcohol, or street drugs in this pregnancy.   Family History:    A three-generation pedigree was created and scanned into Epic under the Media tab.  Patient reports her 63 yo son had a heart murmur that has spontaneously resolved. He has no health concerns and follows with cardiology as needed, every 6 years. This can be genetic or multifactorial, and recurrence risk is limited without additional information. This can be screened for postnatally.  Patient reports her daughter d. 17 yo from lupus. She also has a niece with lupus. We reviewed this is typically multifactorial and usually not due to a single genetic cause but can run in families. Recurrence risk may be higher than the general population risk but is limited due to the multifactorial nature.  Patient ethnicity reported as Mexican and FOB ethnicity reported as Mexican. Denies Ashkenazi Jewish ancestry.  Family history not remarkable for consanguinity, individuals with birth defects, intellectual disability, autism spectrum disorder, multiple spontaneous abortions, still births, or unexplained neonatal death.   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 Therefore, she makes both the typical hemoglobin A and the atypical hemoglobin S (HbA/S). This is typically not associated with symptoms in the carrier. She was not found to be a carrier for the other three conditions screened for (alpha thalassemia, cystic fibrosis, and spinal muscular atrophy) which significantly reduces but does not eliminate the chance of being a carrier. Please see report for residual risk information.  We reviewed beta globin genes, hemoglobin, forms of beta-hemoglobinopathies and their natural histories, and the autosomal mode of inheritance. Clista will either pass down either the normal beta globin gene that produces the normal hemoglobin A or the altered beta globin gene that produces hemoglobin S. There would be a 25% risk for the pregnancy to be affected with sickle cell disease (HbS/S) if Jodi Santos's reproductive partner is also a carrier. There are several types of beta-hemoglobinopathies, including hemoglobin C, hemoglobin O, hemoglobin E, and beta-thalassemia. If Jodi Santos's reproductive partner is found to be a carrier for for a beta-hemoglobinopathy, there would be a 25% chance that this pregnancy would be affected. The type of beta-hemoglobinopathy and symptoms will vary depending on the genotype.  Given these results, we discussed and offered carrier screening for Jisella's reproductive partner. We reviewed the benefits and limitations of carrier screening and that it can detect most but not all carriers.   The couple consented carrier screening for FOB Luis. His blood was drawn during today's visit. He consented that we call Jodi Santos with the results. We reviewed that if both were found to be carriers, prenatal diagnosis through amniocentesis would be available. We reviewed the  technical aspects, benefits, risks, and limitations of amniocentesis including the 1 in 500 risk for miscarriage.   Newborn  Screening. The [place]  Newborn Screening (NBS) program will screen all newborn babies for cystic fibrosis, spinal muscular atrophy, hemoglobinopathies, and numerous other conditions.  Advanced Maternal Age:  The chance that a fetus would be affected with a chromosome difference increases with advanced maternal age. Jodi Santos's current age-related risk to have a pregnancy affected with a chromosome difference is approximately 1 in 49 (~2%). We discussed that this risk may also be lower given her low-risk NIPS results and normal anatomy ultrasound.  Jodi Santos declined amniocentesis.  Previous Testing Completed:  Low risk NIPS: Jodi Santos previously completed [facility] noninvasive prenatal screening (NIPS) in this pregnancy. The result is low risk, consistent with a female fetus. This screening significantly reduces but does not eliminate the chance that the current pregnancy has Down syndrome (trisomy 24), trisomy 44, trisomy 35, common sex chromosome conditions, and 22q11.2 microdeletion syndrome. Please see report for residual risk information. There are many genetic conditions that cannot be detected by NIPS.    Plan of Care:   FOB carrier screening for beta-hemoglobinopathies including sickle cell was collected today. We will communicate the results to the patient. Declined amniocentesis. Follow-up MFC ultrasound on 04/18/2024.   Informed consent was obtained. All questions were answered.   70 minutes were spent on the date of the encounter in service to the patient including preparation, face-to-face consultation, discussion of test reports and available next steps, pedigree construction, genetic risk assessment, documentation, and care coordination.    Thank you for sharing in the care of Jodi Santos with us .  Please do not hesitate to contact us  at [phone] if you have any questions.   Lauraine Bodily, MS, [facility] Certified Genetic Counselor   Genetic counseling student involved in  appointment: No. "

## 2024-04-18 ENCOUNTER — Ambulatory Visit: Payer: Self-pay
